# Patient Record
Sex: Female | Born: 1995 | Hispanic: Yes | Smoking: Never smoker
Health system: Southern US, Community
[De-identification: ages and names within clinical notes are randomized; demographics above are authoritative.]

## PROBLEM LIST (undated history)

## (undated) ENCOUNTER — Inpatient Hospital Stay (HOSPITAL_COMMUNITY): Payer: Self-pay

## (undated) ENCOUNTER — Emergency Department (HOSPITAL_COMMUNITY): Discharge status: Absent without leave | Payer: Self-pay | Source: Home / Self Care

## (undated) DIAGNOSIS — K8020 Calculus of gallbladder without cholecystitis without obstruction: Secondary | ICD-10-CM

## (undated) DIAGNOSIS — D649 Anemia, unspecified: Secondary | ICD-10-CM

## (undated) HISTORY — PX: NO PAST SURGERIES: SHX2092

---

## 2021-01-24 NOTE — L&D Delivery Note (Signed)
Delivery Note Tammy Cherry [name] is a 26 y.o. [phone] at [redacted]w[redacted]d admitted for active labor, fever/chills/UTI sx x 1d.   GBS Status: Negative/-- (06/09 1540) Maximum Maternal Temperature: 98.3 (100.4 at home, took apap @ 1600)  Labor course: Initial SVE: 6.5/80-2 @ 1945. Augmentation with:  none . She then progressed to complete.  ROM: SROM w/ delivery of head, clear fluid Birth: At 2049 a viable female was delivered via spontaneous vaginal delivery (Presentation: ROA). Nuchal cord present: No.  Shoulders and body delivered in usual fashion. Infant placed directly on mom's abdomen for bonding/skin-to-skin, baby dried and stimulated. Cord clamped x 2 after 1 minute and cut by FOB.  Cord blood collected.  The placenta separated spontaneously and delivered via gentle cord traction.  Pitocin infused rapidly IV per protocol.  Initial EBL ~6ml, fundus boggy ~48ml clots cleared from LUS, cytotec ( buccal/471mcg rectal) given, again fundus boggy, ~ clots cleared from LUS, given TXA and methergine, fentanyl, uterine sweep w/o any retained products. In & out cath, urine. Fundus firming, few more clots removed from LUS. Then fundus firm w/o clots on next check.  Placenta inspected and appears to be intact with a 3 VC.  Placenta/Cord with the following complications: none .  Cord pH: not done Sponge and instrument count were correct x2.  Intrapartum complications:  rapid labor Anesthesia:  none Episiotomy: none Lacerations:  none Suture Repair:  n/a QBL (mL): 242   Infant: APGAR (1 MIN):   APGAR (5 MINS):   APGAR (10 MINS):    Infant weight: pending  Mom to postpartum.  Baby to Couplet care / Skin to Skin. Placenta to Pathology for fever    Rocephin 2gm IV q 24hr Urine culture pending Plans to Breastfeed Contraception:  plans outpatient Nexplanon Circumcision: N/A  Note sent to [facility] for pp visit.  Cheral Marker CNM, [facility] 07/06/2021 9:41 PM

## 2021-03-01 ENCOUNTER — Other Ambulatory Visit: Payer: Self-pay | Admitting: *Deleted

## 2021-03-01 ENCOUNTER — Other Ambulatory Visit: Payer: Self-pay

## 2021-03-01 DIAGNOSIS — Z3481 Encounter for supervision of other normal pregnancy, first trimester: Secondary | ICD-10-CM

## 2021-03-04 ENCOUNTER — Encounter: Payer: Self-pay | Admitting: Family Medicine

## 2021-03-04 DIAGNOSIS — O09899 Supervision of other high risk pregnancies, unspecified trimester: Secondary | ICD-10-CM | POA: Insufficient documentation

## 2021-03-04 DIAGNOSIS — O99891 Other specified diseases and conditions complicating pregnancy: Secondary | ICD-10-CM | POA: Insufficient documentation

## 2021-03-04 DIAGNOSIS — Z2839 Other underimmunization status: Secondary | ICD-10-CM | POA: Insufficient documentation

## 2021-03-04 LAB — CBC/D/PLT+RPR+RH+ABO+RUBIGG...
Antibody Screen: NEGATIVE
Basophils Absolute: 0.1 10*3/uL (ref 0.0–0.2)
Basos: 1 %
Bilirubin, UA: NEGATIVE
EOS (ABSOLUTE): 0.1 10*3/uL (ref 0.0–0.4)
Eos: 1 %
Glucose, UA: NEGATIVE
HCV Ab: <0.1 s/co ratio (ref 0.0–0.9)
HIV Screen 4th Generation wRfx: NONREACTIVE
Hematocrit: 30.7 % — ABNORMAL LOW (ref 34.0–46.6)
Hemoglobin: 10.5 g/dL — ABNORMAL LOW (ref 11.1–15.9)
Hepatitis B Surface Ag: NEGATIVE
Immature Grans (Abs): 0.1 10*3/uL (ref 0.0–0.1)
Immature Granulocytes: 1 %
Ketones, UA: NEGATIVE
Lymphocytes Absolute: 2.3 10*3/uL (ref 0.7–3.1)
Lymphs: 19 %
MCH: 25.5 pg — ABNORMAL LOW (ref 26.6–33.0)
MCHC: 34.2 g/dL (ref 31.5–35.7)
MCV: 75 fL — ABNORMAL LOW (ref 79–97)
Monocytes Absolute: 0.8 10*3/uL (ref 0.1–0.9)
Monocytes: 6 %
Neutrophils Absolute: 8.7 10*3/uL — ABNORMAL HIGH (ref 1.4–7.0)
Neutrophils: 72 %
Nitrite, UA: POSITIVE — AB
Platelets: 294 10*3/uL (ref 150–450)
Protein,UA: NEGATIVE
RBC: 4.12 x10E6/uL (ref 3.77–5.28)
RDW: 14.3 % (ref 11.7–15.4)
RPR Ser Ql: NONREACTIVE
Rh Factor: POSITIVE
Rubella Antibodies, IGG: <0.9 index — ABNORMAL LOW (ref >0.99)
Specific Gravity, UA: 1.005 (ref 1.005–1.030)
Urobilinogen, Ur: 0.2 mg/dL (ref 0.2–1.0)
WBC: 12.2 10*3/uL — ABNORMAL HIGH (ref 3.4–10.8)
pH, UA: 7 (ref 5.0–7.5)

## 2021-03-04 LAB — HGB FRACTIONATION CASCADE
Hgb A2: 2.4 % (ref 1.8–3.2)
Hgb A: 97.6 % (ref 96.4–98.8)
Hgb F: 0 % (ref 0.0–2.0)
Hgb S: 0 %

## 2021-03-04 LAB — URINE CULTURE, OB REFLEX

## 2021-03-04 LAB — MICROSCOPIC EXAMINATION
Casts: NONE SEEN /lpf
Epithelial Cells (non renal): >10 /hpf — AB (ref 0–10)
RBC, Urine: NONE SEEN /hpf (ref 0–2)
WBC, UA: >30 /hpf — AB (ref 0–5)

## 2021-03-04 LAB — HCV INTERPRETATION

## 2021-03-07 NOTE — Progress Notes (Signed)
Patient Name: Tammy Cherry Date of Birth: Dec 04, 1995 [place] Initial Prenatal Visit  Tammy Cherry Tammy Cherry is a 26 y.o. year old No obstetric history on file. at Unknown who presents for her initial prenatal visit. Pregnancy is planned She reports  None . She is taking a prenatal vitamin.  She denies pelvic pain or vaginal bleeding.   Pregnancy Dating: The patient is dated by LMP.  LMP: 4/92/01 Period is certain:  Yes.  Periods were regular:  Yes.  LMP was a typical period:  Yes.  Using hormonal contraception in 3 months prior to conception: No  Lab Review: Blood type: O Rh Status: + Antibody screen: Negative HIV: Negative RPR: Negative Hemoglobin electrophoresis reviewed: Yes Results of OB urine culture are: Positive for e. Coli UTI  Rubella: Not immune Hep C Ab: Negative Varicella status: immune, had at 26 years old.   PMH: Reviewed and as detailed below: HTN: No  Gestational Hypertension/preeclampsia: No  Type 1 or 2 Diabetes: No  Depression:  No  Seizure disorder:  No VTE: No ,  History of STI No,  Abnormal Pap smear:  No, last one was in 2018 Genital herpes simplex:  No   PSH: Gynecologic Surgery:  no Surgical history reviewed, notable for: None  Obstetric History: Obstetric history tab updated and reviewed.  Summary of prior pregnancies: G3P2002 2015, female born at 74 weeks via SVD in [place] 2018, female born at 51 weeks via SVD in [place], spent time in the NICU for "low weight", there for 8 days Cesarean delivery: No  Gestational Diabetes:  No Hypertension in pregnancy: No History of preterm birth: No History of LGA/SGA infant:  Unclear, states her second son had spent time in NICU for being "low weight" History of shoulder dystocia: No Indications for referral were reviewed, and the patient has no obstetric indications for referral to [facility] at this time.   Social History: Partner's name: Network engineer  Tobacco use: No Alcohol use:  No Other substance use:  No  Current Medications:  Prenatals  Reviewed and appropriate in pregnancy.   Genetic and Infection Screen: Flow Sheet Updated Yes  Prenatal Exam: Gen: Well nourished, well developed.  No distress.  Vitals noted. HEENT: Normocephalic, atraumatic.  Neck supple.  Fair dentition. CV: RRR no murmur, gallops or rubs Lungs: CTA B.  Normal respiratory effort without wheezes or rales. Abd: soft, gravid, NTND. +BS.  GU: Normal external female genitalia without lesions.  Nl vaginal, well rugated without lesions. No vaginal discharge.  Bimanual exam: No adnexal mass or TTP. No CMT.  Uterus size above umbilicus Ext: No clubbing, cyanosis or edema. Psych: Normal grooming and dress.  Not depressed or anxious appearing.  Normal thought content and process without flight of ideas or looseness of associations  Fetal heart tones: Appropriate  Assessment/Plan:  Tammy Cherry is a 26 y.o. No obstetric history on file. at Unknown who presents to initiate prenatal care. She is doing well.  Current pregnancy issues include None.  Routine prenatal care: As dating is not reliable, a dating ultrasound has not been ordered. Dating tab updated. An anatomy ultrasound was ordered. Pre-pregnancy weight updated. Expected weight gain this pregnancy is 25-35 pounds - unclear as she does not know pre-pregnancy weight. Prenatal labs reviewed, notable for E. Coli UTI- rubella non-immune. O positive, antibody negative. Indications for referral to [facility] were reviewed and the patient does not meet criteria for referral.  Medication list  reviewed and updated.  Recommended patient see a dentist for regular care.  Bleeding and pain precautions reviewed. Importance of prenatal vitamins reviewed.  Genetic screening offered. Patient opted for: no screening. The patient has the following indications for aspirinto begin 81 mg at 12-16 weeks: One  high risk condition: no single high risk condition  MORE than one moderate risk condition: low SES   Aspirin was not  recommended today based upon above risk factors (one high risk condition or more than one moderate risk factor)  The patient will not be age 62 or over at time of delivery. Referral to genetic counseling was not offered today.  The patient has the following risk factors for preexisting diabetes: Reviewed indications for early 1 hour glucose testing, not indicated . An early 1 hour glucose tolerance test was not ordered. Pregnancy Medical Home and PHQ-9 forms completed, problems noted: Yes  2. Pregnancy issues include the following which were addressed today:  E. Coli UTI- will treat with Keflex 500 mg QID x5 days, consider repeat Urine culture at follow up. Rubella non-immune: offer MMR post-partum Unsure if vaccinated against Hep B, could consider screening at follow up to assess for immunity Unclear if history of SGA infant? Reports NICU stay for low weight. Unable to obtain records as birth occurred in [place] Medical release signed today to obtain records from [place], uninsured: adopt-A-mom patient  Unable to schedule anatomy ultrasound today as [facility] CMA to call at end of month to schedule.   Follow up 4 weeks for next prenatal visit with Faculty.

## 2021-03-08 ENCOUNTER — Other Ambulatory Visit (HOSPITAL_COMMUNITY)
Admission: RE | Admit: 2021-03-08 | Discharge: 2021-03-08 | Disposition: A | Discharge status: Home / Self Care | Payer: Self-pay | Source: Ambulatory Visit | Attending: Family Medicine | Admitting: Family Medicine

## 2021-03-08 ENCOUNTER — Encounter: Payer: Self-pay | Admitting: Family Medicine

## 2021-03-08 ENCOUNTER — Other Ambulatory Visit: Payer: Self-pay

## 2021-03-08 ENCOUNTER — Ambulatory Visit (INDEPENDENT_AMBULATORY_CARE_PROVIDER_SITE_OTHER): Payer: Self-pay | Admitting: Family Medicine

## 2021-03-08 VITALS — BP 120/72 | HR 99 | Temp 98.2°F | Wt 112.2 lb

## 2021-03-08 DIAGNOSIS — Z124 Encounter for screening for malignant neoplasm of cervix: Secondary | ICD-10-CM | POA: Insufficient documentation

## 2021-03-08 DIAGNOSIS — Z3490 Encounter for supervision of normal pregnancy, unspecified, unspecified trimester: Secondary | ICD-10-CM | POA: Insufficient documentation

## 2021-03-08 DIAGNOSIS — Z3480 Encounter for supervision of other normal pregnancy, unspecified trimester: Secondary | ICD-10-CM

## 2021-03-08 LAB — POCT WET PREP (WET MOUNT)
Clue Cells Wet Prep Whiff POC: NEGATIVE
Trichomonas Wet Prep HPF POC: ABSENT

## 2021-03-08 MED ORDER — CEPHALEXIN 500 MG PO CAPS: 500.0000 mg | ORAL_CAPSULE | Freq: Four times a day (QID) | ORAL | 0 refills | Status: AC

## 2021-03-08 NOTE — Patient Instructions (Signed)
Fue maravilloso verte hoy.  Por favor traiga TODOS sus medicamentos a cada visita.  Hoy hablamos de:  Envi un antibitico a su farmacia para tratar una infeccin del tracto urinario. Tome esto cuatro veces al da durante 5 [place]. Volveremos a revisar Lauris Poag de [place] en su prxima cita.  Hicimos una prueba de Papanicolaou hoy y estamos haciendo pruebas de rutina para Engineer, manufacturing infecciones de transmisin sexual. Conley Rolls enviar un mensaje de MyChart o lo llamar con los [place].  Contine tomando sus vitaminas prenatales diariamente. Le estamos programando una ecografa de anatoma.  Si experimenta sangrado vaginal, prdida de lquidos, no siente que su beb se mueva tanto o comienza a Technical brewer con menos de 5 minutos de diferencia, vaya directamente a la Unidad de Evaluacin Materna en [facility] para una evaluacin.  Servicios de atencin de mujeres y maternidad ubicados en el lado sur de [place]. [facility] (Entrada C en [address] [place], [place] [address]     Devona Konig elegir Medicina familiar de [place].  Llame al [phone] si tiene alguna pregunta sobre la cita de [place].  Asegrese de programar un seguimiento en la recepcin antes de irse hoy.  Sabino Dick, D.O. PGY-2 Medicina Familiar

## 2021-03-09 LAB — CYTOLOGY - PAP
Chlamydia: NEGATIVE
Comment: NEGATIVE
Comment: NORMAL
Diagnosis: NEGATIVE
Neisseria Gonorrhea: NEGATIVE

## 2021-03-19 ENCOUNTER — Emergency Department (HOSPITAL_COMMUNITY): Payer: Self-pay

## 2021-03-19 ENCOUNTER — Other Ambulatory Visit: Payer: Self-pay

## 2021-03-19 ENCOUNTER — Inpatient Hospital Stay (HOSPITAL_COMMUNITY)
Admission: EM | Admit: 2021-03-19 | Discharge: 2021-03-20 | Disposition: A | Discharge status: Home / Self Care | Payer: Self-pay | Attending: Obstetrics and Gynecology | Admitting: Obstetrics and Gynecology

## 2021-03-19 DIAGNOSIS — Z3A22 22 weeks gestation of pregnancy: Secondary | ICD-10-CM | POA: Insufficient documentation

## 2021-03-19 DIAGNOSIS — T1490XA Injury, unspecified, initial encounter: Secondary | ICD-10-CM

## 2021-03-19 DIAGNOSIS — Z6790 Unspecified blood type, Rh positive: Secondary | ICD-10-CM

## 2021-03-19 DIAGNOSIS — Z3A23 23 weeks gestation of pregnancy: Secondary | ICD-10-CM

## 2021-03-19 DIAGNOSIS — Z6740 Type O blood, Rh positive: Secondary | ICD-10-CM | POA: Insufficient documentation

## 2021-03-19 DIAGNOSIS — O9A212 Injury, poisoning and certain other consequences of external causes complicating pregnancy, second trimester: Secondary | ICD-10-CM

## 2021-03-19 DIAGNOSIS — O26892 Other specified pregnancy related conditions, second trimester: Secondary | ICD-10-CM | POA: Insufficient documentation

## 2021-03-19 DIAGNOSIS — Y93E1 Activity, personal bathing and showering: Secondary | ICD-10-CM | POA: Insufficient documentation

## 2021-03-19 DIAGNOSIS — S0990XA Unspecified injury of head, initial encounter: Secondary | ICD-10-CM | POA: Insufficient documentation

## 2021-03-19 DIAGNOSIS — W182XXA Fall in (into) shower or empty bathtub, initial encounter: Secondary | ICD-10-CM | POA: Insufficient documentation

## 2021-03-19 LAB — COMPREHENSIVE METABOLIC PANEL
ALT: 18 U/L (ref 0–44)
AST: 18 U/L (ref 15–41)
Albumin: 3.3 g/dL — ABNORMAL LOW (ref 3.5–5.0)
Alkaline Phosphatase: 90 U/L (ref 38–126)
Anion gap: 12 (ref 5–15)
BUN: <5 mg/dL — ABNORMAL LOW (ref 6–20)
CO2: 20 mmol/L — ABNORMAL LOW (ref 22–32)
Calcium: 9 mg/dL (ref 8.9–10.3)
Chloride: 103 mmol/L (ref 98–111)
Creatinine, Ser: 0.49 mg/dL (ref 0.44–1.00)
GFR, Estimated: >60 mL/min (ref >60)
Glucose, Bld: 79 mg/dL (ref 70–99)
Potassium: 3.8 mmol/L (ref 3.5–5.1)
Sodium: 135 mmol/L (ref 135–145)
Total Bilirubin: 0.1 mg/dL — ABNORMAL LOW (ref 0.3–1.2)
Total Protein: 7.3 g/dL (ref 6.5–8.1)

## 2021-03-19 LAB — I-STAT CHEM 8, ED
BUN: <3 mg/dL — ABNORMAL LOW (ref 6–20)
Calcium, Ion: 1.14 mmol/L — ABNORMAL LOW (ref 1.15–1.40)
Chloride: 104 mmol/L (ref 98–111)
Creatinine, Ser: 0.4 mg/dL — ABNORMAL LOW (ref 0.44–1.00)
Glucose, Bld: 78 mg/dL (ref 70–99)
HCT: 36 % (ref 36.0–46.0)
Hemoglobin: 12.2 g/dL (ref 12.0–15.0)
Potassium: 3.9 mmol/L (ref 3.5–5.1)
Sodium: 136 mmol/L (ref 135–145)
TCO2: 22 mmol/L (ref 22–32)

## 2021-03-19 LAB — CBC
HCT: 35.5 % — ABNORMAL LOW (ref 36.0–46.0)
Hemoglobin: 11.3 g/dL — ABNORMAL LOW (ref 12.0–15.0)
MCH: 25.2 pg — ABNORMAL LOW (ref 26.0–34.0)
MCHC: 31.8 g/dL (ref 30.0–36.0)
MCV: 79.2 fL — ABNORMAL LOW (ref 80.0–100.0)
Platelets: 361 10*3/uL (ref 150–400)
RBC: 4.48 MIL/uL (ref 3.87–5.11)
RDW: 14.2 % (ref 11.5–15.5)
WBC: 15.5 10*3/uL — ABNORMAL HIGH (ref 4.0–10.5)
nRBC: 0 % (ref 0.0–0.2)

## 2021-03-19 LAB — PROTIME-INR
INR: 1 (ref 0.8–1.2)
Prothrombin Time: 13.2 seconds (ref 11.4–15.2)

## 2021-03-19 LAB — LACTIC ACID, PLASMA: Lactic Acid, Venous: 1.3 mmol/L (ref 0.5–1.9)

## 2021-03-19 LAB — ETHANOL: Alcohol, Ethyl (B): <10 mg/dL (ref <10)

## 2021-03-19 LAB — SAMPLE TO BLOOD BANK

## 2021-03-19 MED ORDER — IOHEXOL 300 MG/ML  SOLN
100.0000 mL | Freq: Once | INTRAMUSCULAR | Status: AC | PRN
  Administered 2021-03-19: 100 mL via INTRAVENOUS

## 2021-03-19 MED ORDER — SODIUM CHLORIDE 0.9 % IV BOLUS
1000.0000 mL | Freq: Once | INTRAVENOUS | Status: AC
Start: 2021-03-19 — End: 2021-03-19
  Administered 2021-03-19: 1000 mL via INTRAVENOUS

## 2021-03-19 NOTE — ED Notes (Signed)
Rapid OB contacted and enroute 

## 2021-03-19 NOTE — ED Triage Notes (Signed)
Pt came in POV after fall in the shower. Patient reports abd pain, [redacted] weeks pregnant. Spanish speaking, A&O x4, reports 10/10 shoulder pain at this time.

## 2021-03-19 NOTE — Progress Notes (Signed)
Late entry  2158 RROB received call for patient in Main ED, [redacted] wks gestation s/p fall in the shower, landed on abdomen and hit head with loc. Room 32  2205 RROB arrived to room 32. Patient being hooked up to [place] and ctx monitor by ED staff. With patient gestation 22.6wks, this RN used doppler to get FHT at 120's, no contractions showing on monitor. Patient denies contractions, vaginal bleeding or leaking of fluid. Patient complains of head pain on left side and abdominal pain. Patient states she see's [facility] for care.  2214 RROB contacted OB Attending and provided above report and ED providers name and current plan of care. ED is to call OB Attending once patient is cleared from ED so that patient can be transferred to MAU for additional monitoring. RROB is to be called if patient symptoms change from [facility] standpoint (such as patient begins to have contractions, vaginal bleeding, vaginal fluid leaking, etc.) ED provider and RN verbalized understanding.   [place] arrived back on L&D unit  Wendi Maya, RN RROB

## 2021-03-19 NOTE — ED Provider Notes (Addendum)
[facility] EMERGENCY DEPARTMENT Provider Note   CSN: [id number] Arrival date & time: 03/19/21  2134     History  Chief Complaint  Patient presents with   Tammy Cherry is a 26 y.o. female.  The history is provided by the patient, medical records and a significant other. The history is limited by a language barrier. A language interpreter was used.  Fall This is a new problem. The current episode started less than 1 hour ago. The problem has not changed since onset.Associated symptoms include chest pain, abdominal pain and headaches. Pertinent negatives include no shortness of breath. Nothing aggravates the symptoms. Nothing relieves the symptoms. She has tried nothing for the symptoms. The treatment provided no relief.      Home Medications Prior to Admission medications   Medication Sig Start Date End Date Taking? Authorizing Provider  Prenatal Vit-Fe Fumarate-FA (MULTIVITAMIN-PRENATAL) 27-0.8 MG TABS tablet Take 1 tablet by mouth daily at 12 noon.    [provider]      Allergies    Patient has no allergy information on record.    Review of Systems   Review of Systems  Constitutional:  Negative for chills, diaphoresis and fatigue.  HENT:  Negative for congestion.   Eyes:  Negative for visual disturbance.  Respiratory:  Negative for chest tightness and shortness of breath.   Cardiovascular:  Positive for chest pain.  Gastrointestinal:  Positive for abdominal pain. Negative for constipation, diarrhea, nausea and vomiting.  Genitourinary:  Negative for dysuria and flank pain.  Musculoskeletal:  Negative for back pain, neck pain and neck stiffness.  Skin:  Negative for rash and wound.  Neurological:  Positive for syncope (with head injury) and headaches. Negative for dizziness and light-headedness.  Psychiatric/Behavioral:  Negative for agitation.   All other systems reviewed and are negative.  Physical Exam Updated Vital  Signs BP 133/89    Pulse 99    Temp 98.1 F (36.7 C) (Oral)    Resp 20    Ht 5' 4.96" (1.65 m)    Wt 50.9 kg    LMP 10/10/2020    SpO2 100%    BMI 18.70 kg/m  Physical Exam Vitals and nursing note reviewed.  Constitutional:      General: She is not in acute distress.    Appearance: She is well-developed. She is not ill-appearing, toxic-appearing or diaphoretic.  HENT:     Head: Normocephalic and atraumatic.     Nose: Nose normal. No congestion.     Mouth/Throat:     Mouth: Mucous membranes are moist.  Eyes:     Extraocular Movements: Extraocular movements intact.     Conjunctiva/sclera: Conjunctivae normal.     Pupils: Pupils are equal, round, and reactive to light.  Cardiovascular:     Rate and Rhythm: Normal rate and regular rhythm.     Heart sounds: No murmur heard. Pulmonary:     Effort: Pulmonary effort is normal. No respiratory distress.     Breath sounds: Normal breath sounds. No wheezing, rhonchi or rales.  Chest:     Chest wall: Tenderness present.  Abdominal:     Palpations: Abdomen is soft.     Tenderness: There is abdominal tenderness.  Musculoskeletal:        General: No swelling.     Cervical back: Neck supple. No tenderness.     Right lower leg: No edema.     Left lower leg: No edema.  Skin:  General: Skin is warm and dry.     Capillary Refill: Capillary refill takes less than 2 seconds.     Findings: No erythema.  Neurological:     General: No focal deficit present.     Mental Status: She is alert.  Psychiatric:        Mood and Affect: Mood normal.    ED Results / Procedures / Treatments   Labs (all labs ordered are listed, but only abnormal results are displayed) Labs Reviewed  COMPREHENSIVE METABOLIC PANEL - Abnormal; Notable for the following components:      Result Value   CO2 20 (*)    BUN <5 (*)    Albumin 3.3 (*)    Total Bilirubin 0.1 (*)    All other components within normal limits  CBC - Abnormal; Notable for the following  components:   WBC 15.5 (*)    Hemoglobin 11.3 (*)    HCT 35.5 (*)    MCV 79.2 (*)    MCH 25.2 (*)    All other components within normal limits  I-STAT CHEM 8, ED - Abnormal; Notable for the following components:   BUN <3 (*)    Creatinine, Ser 0.40 (*)    Calcium, Ion 1.14 (*)    All other components within normal limits  RESP PANEL BY RT-PCR (FLU A&B, COVID) ARPGX2  ETHANOL  LACTIC ACID, PLASMA  PROTIME-INR  URINALYSIS, ROUTINE W REFLEX MICROSCOPIC  SAMPLE TO BLOOD BANK    EKG None  Radiology DG Pelvis Portable  Result Date: 03/19/2021 CLINICAL DATA:  Pelvic pain fell in shower EXAM: PORTABLE PELVIS 1-2 VIEWS COMPARISON:  None. FINDINGS: SI joints are non widened. Pubic symphysis and rami are intact. No fracture is seen. IUP is present. IMPRESSION: No acute osseous abnormality Electronically Signed   By: Donavan Foil M.D.   On: 03/19/2021 23:06   DG Chest Port 1 View  Result Date: 03/19/2021 CLINICAL DATA:  Fall, chest injury. EXAM: PORTABLE CHEST 1 VIEW COMPARISON:  None. FINDINGS: The heart size and mediastinal contours are within normal limits. Both lungs are clear. The visualized skeletal structures are unremarkable. IMPRESSION: No active disease. Electronically Signed   By: Fidela Salisbury M.D.   On: 03/19/2021 22:21    Procedures Procedures    Medications Ordered in ED Medications  sodium chloride 0.9 % bolus 1,000 mL (0 mLs Intravenous Stopped 03/19/21 2315)    ED Course/ Medical Decision Making/ A&P                           Medical Decision Making Amount and/or Complexity of Data Reviewed Labs: ordered. Radiology: ordered.  Risk Prescription drug management. Decision regarding hospitalization.   Tammy Cherry is a 26 y.o. female who is currently 22 weeks and 6 days pregnant who presents for fall with trauma.  According to patient and family, patient was in the shower today and had a fall hitting her head and knocking herself out.  She is  complaining of pain in her left upper chest as well as her abdomen.  She reports that her baby was moving vigorously before the fall and then since the fall has not had any movement.  She denies any vaginal bleeding or loss  or gush of fluids.  She is reporting severe pain in her abdomen and severe headache.  She denies nausea, vomiting, or vision changes.  Denies speech difficulties.  Denies any numbness or tingling or weakness in  extremities.  No history of significant injuries.  She reports that UTI with her pregnancy earlier but no other pregnancy complications.  She was made a level 2 trauma upon arrival due to the abdominal pain and pregnancy.  On my exam, abdomen is slightly tender to palpation.  Bowel sounds are appreciated.  Lungs were clear.  Left upper chest was tender to palpation.  No lacerations or tenderness focally on her head.  No focal neurologic deficits initially.  Interpreter was used for all conversation.  B arrived and was able to get fetal heart tones.  X-ray was obtained of the chest and I did not see pneumothorax however with her significant pain in the chest, abdomen, head, after discussion with trauma surgery, we will get CT of the head and chest/abdomen/pelvis to rule out acute traumatic injuries.  We will also get labs and give her some fluids.  The OB/GYN team feels that when she is medically cleared, she will be stable to go to the MAU for further more extended monitoring.  Care transferred to oncoming team to await CT imaging prior to transfer to the MAU for further monitoring.  12:00 AM CTs returned showing no evidence of acute traumatic injuries.  I spoke to Dr. Delphia Grates with the OB/GYN team who did not feel that ultrasound was needed.  They do want to have the patient transferred to the MAU so they can monitor for several more hours and then she will likely go home after.  On reassessment she is feeling better but she still having some pain with her left upper chest.   Suspect musculoskeletal pain.  She was able to move her arm and had no tenderness in the deltoid area down the shoulder towards the hand.  Normal grip strength, pulse, and sensation.  Patient will be transferred to the MAU for further monitoring and management.  Anticipate discharge home.  She is not having any urinary symptoms.  We will add on urine culture as there is some rare bacteria.  Will defer management of this to the OB/GYN team.  Anticipate discharge after monitoring by OB.   Final Clinical Impression(s) / ED Diagnoses Final diagnoses:  Trauma     Clinical Impression: 1. Trauma     Disposition: Care transferred to oncoming team to await CT imaging to rule out acute traumatic injuries.  If CT imaging is reassuring, anticipate transfer to the MAU for further monitoring of abdominal pain after fall with pregnancy.  This note was prepared with assistance of Systems analyst. Occasional wrong-word or sound-a-like substitutions may have occurred due to the inherent limitations of voice recognition software.      Allie Ousley, Gwenyth Allegra, MD 03/19/21 2332    Aldea Avis, Gwenyth Allegra, MD 03/20/21 0002

## 2021-03-20 ENCOUNTER — Encounter (HOSPITAL_COMMUNITY): Payer: Self-pay | Admitting: Obstetrics and Gynecology

## 2021-03-20 DIAGNOSIS — Z6790 Unspecified blood type, Rh positive: Secondary | ICD-10-CM

## 2021-03-20 DIAGNOSIS — W182XXA Fall in (into) shower or empty bathtub, initial encounter: Secondary | ICD-10-CM

## 2021-03-20 DIAGNOSIS — O9A212 Injury, poisoning and certain other consequences of external causes complicating pregnancy, second trimester: Secondary | ICD-10-CM

## 2021-03-20 DIAGNOSIS — Z3A23 23 weeks gestation of pregnancy: Secondary | ICD-10-CM

## 2021-03-20 DIAGNOSIS — S0990XA Unspecified injury of head, initial encounter: Secondary | ICD-10-CM

## 2021-03-20 LAB — URINALYSIS, ROUTINE W REFLEX MICROSCOPIC
Bilirubin Urine: NEGATIVE
Glucose, UA: NEGATIVE mg/dL
Ketones, ur: NEGATIVE mg/dL
Nitrite: NEGATIVE
Protein, ur: NEGATIVE mg/dL
Specific Gravity, Urine: 1.005 (ref 1.005–1.030)
pH: 6 (ref 5.0–8.0)

## 2021-03-20 MED ORDER — CYCLOBENZAPRINE HCL 10 MG PO TABS: 10.0000 mg | ORAL_TABLET | Freq: Three times a day (TID) | ORAL | 0 refills | Status: DC | PRN

## 2021-03-20 MED ORDER — CYCLOBENZAPRINE HCL 5 MG PO TABS
10.0000 mg | ORAL_TABLET | Freq: Once | ORAL | Status: AC
  Administered 2021-03-20: 10 mg via ORAL
  Filled 2021-03-20: qty 2

## 2021-03-20 MED ORDER — TRAMADOL HCL 50 MG PO TABS: 50.0000 mg | ORAL_TABLET | Freq: Four times a day (QID) | ORAL | 0 refills | Status: DC | PRN

## 2021-03-20 NOTE — ED Notes (Signed)
Report given to Wheeler, RN at MAU, transport called to take pt to MAU.

## 2021-03-20 NOTE — MAU Provider Note (Signed)
Chief Complaint: Fall   Event Date/Time   First Provider Initiated Contact with Patient 03/20/21 0113      SUBJECTIVE HPI: Tammy Cherry is a 26 y.o. G3P2002 at [redacted]w[redacted]d by LMP who presents to maternity admissions sent from the ED following a fall in the shower hitting her head and shoulder area and causing her to lose consciousness.  She reports good fetal movement and denies any abdominal pain. She does not think she hit her abdomen directly.  She reports headache, especially on left side where she hit her head as 10/10 pain and pain in her left shoulder/arm and left upper chest as 7-8 out of 10.  The pain is constant but worsens with movement.  There is no associated nausea or vomiting.  She was evaluated in the ED with head and chest CT and CXR without abnormal findings and was transferred to MAU for monitoring of pregnancy.    HPI  History reviewed. No pertinent past medical history. History reviewed. No pertinent surgical history. Social History   Socioeconomic History   Marital status: Single    Spouse name: Not on file   Number of children: Not on file   Years of education: Not on file   Highest education level: Not on file  Occupational History   Not on file  Tobacco Use   Smoking status: Never   Smokeless tobacco: Never  Vaping Use   Vaping Use: Never used  Substance and Sexual Activity   Alcohol use: Never   Drug use: Never   Sexual activity: Yes    Birth control/protection: None  Other Topics Concern   Not on file  Social History Narrative   Not on file   Social Determinants of Health   Financial Resource Strain: Not on file  Food Insecurity: Not on file  Transportation Needs: Not on file  Physical Activity: Not on file  Stress: Not on file  Social Connections: Not on file  Intimate Partner Violence: Not on file   No current facility-administered medications on file prior to encounter.   Current Outpatient Medications on File Prior to Encounter   Medication Sig Dispense Refill   Prenatal Vit-Fe Fumarate-FA (MULTIVITAMIN-PRENATAL) 27-0.8 MG TABS tablet Take 1 tablet by mouth daily at 12 noon.     Not on File  ROS:  Review of Systems  Constitutional:  Negative for chills, fatigue and fever.  Eyes:  Negative for visual disturbance.  Respiratory:  Negative for shortness of breath.   Cardiovascular:  Positive for chest pain.  Gastrointestinal:  Negative for abdominal pain, nausea and vomiting.  Genitourinary:  Negative for difficulty urinating, dysuria, flank pain, pelvic pain, vaginal bleeding, vaginal discharge and vaginal pain.  Musculoskeletal:  Positive for myalgias.  Neurological:  Positive for syncope and headaches. Negative for dizziness.  Psychiatric/Behavioral: Negative.      I have reviewed patient's Past Medical Hx, Surgical Hx, Family Hx, Social Hx, medications and allergies.   Physical Exam  Patient Vitals for the past 24 hrs:  BP Temp Temp src Pulse Resp SpO2 Height Weight  03/20/21 0058 125/75 98.9 F (37.2 C) Oral 88 18 99 % -- 67 kg  03/20/21 0000 113/70 98 F (36.7 C) Oral 86 18 100 % -- --  03/19/21 2300 116/78 -- -- 87 (!) 21 100 % -- --  03/19/21 2222 -- -- -- -- -- -- 5' 4.96" (1.65 m) 50.9 kg  03/19/21 2218 -- 98.1 F (36.7 C) Oral -- -- -- -- --  03/19/21  2206 133/89 -- -- 99 20 100 % -- --   Constitutional: Well-developed, well-nourished female in mild distress.  Cardiovascular: normal rate Respiratory: normal effort GI: Abd soft, non-tender. Pos BS x 4 MS: Extremities nontender, no edema, normal ROM Neurological - PERRLA, alert, oriented, normal speech, no focal findings or movement disorder noted, screening mental status exam normal, cranial nerves II through XII intact, DTR's normal and symmetric, motor and sensory grossly normal bilaterally, normal muscle tone, no tremors, strength 5/5  GU: Neg CVAT.  PELVIC EXAM: Deferred  FHR baseline 135 with moderate variability and 10 x 10 accels  present No contractions on toco or to palpation  LAB RESULTS Results for orders placed or performed during the hospital encounter of 03/19/21 (from the past 24 hour(s))  Sample to Blood Bank     Status: None   Collection Time: 03/19/21 10:14 PM  Result Value Ref Range   Blood Bank Specimen SAMPLE AVAILABLE FOR TESTING    Sample Expiration      03/20/2021,2359 Performed at [facility], [place] [address], [place], [place] [address]   Comprehensive metabolic panel     Status: Abnormal   Collection Time: 03/19/21 10:19 PM  Result Value Ref Range   Sodium 135 135 - 145 mmol/L   Potassium 3.8 3.5 - 5.1 mmol/L   Chloride 103 98 - 111 mmol/L   CO2 20 (L) 22 - 32 mmol/L   Glucose, Bld 79 70 - 99 mg/dL   BUN <5 (L) 6 - 20 mg/dL   Creatinine, Ser 0.49 0.44 - 1.00 mg/dL   Calcium 9.0 8.9 - 10.3 mg/dL   Total Protein 7.3 6.5 - 8.1 g/dL   Albumin 3.3 (L) 3.5 - 5.0 g/dL   AST 18 15 - 41 U/L   ALT 18 0 - 44 U/L   Alkaline Phosphatase 90 38 - 126 U/L   Total Bilirubin 0.1 (L) 0.3 - 1.2 mg/dL   GFR, Estimated >60 >60 mL/min   Anion gap 12 5 - 15  CBC     Status: Abnormal   Collection Time: 03/19/21 10:19 PM  Result Value Ref Range   WBC 15.5 (H) 4.0 - 10.5 K/uL   RBC 4.48 3.87 - 5.11 MIL/uL   Hemoglobin 11.3 (L) 12.0 - 15.0 g/dL   HCT 35.5 (L) 36.0 - 46.0 %   MCV 79.2 (L) 80.0 - 100.0 fL   MCH 25.2 (L) 26.0 - 34.0 pg   MCHC 31.8 30.0 - 36.0 g/dL   RDW 14.2 11.5 - 15.5 %   Platelets 361 150 - 400 K/uL   nRBC 0.0 0.0 - 0.2 %  Ethanol     Status: None   Collection Time: 03/19/21 10:19 PM  Result Value Ref Range   Alcohol, Ethyl (B) <10 <10 mg/dL  Lactic acid, plasma     Status: None   Collection Time: 03/19/21 10:19 PM  Result Value Ref Range   Lactic Acid, Venous 1.3 0.5 - 1.9 mmol/L  Protime-INR     Status: None   Collection Time: 03/19/21 10:19 PM  Result Value Ref Range   Prothrombin Time 13.2 11.4 - 15.2 seconds   INR 1.0 0.8 - 1.2  I-Stat Chem 8, ED     Status: Abnormal    Collection Time: 03/19/21 10:22 PM  Result Value Ref Range   Sodium 136 135 - 145 mmol/L   Potassium 3.9 3.5 - 5.1 mmol/L   Chloride 104 98 - 111 mmol/L   BUN <3 (  L) 6 - 20 mg/dL   Creatinine, Ser 0.40 (L) 0.44 - 1.00 mg/dL   Glucose, Bld 78 70 - 99 mg/dL   Calcium, Ion 1.14 (L) 1.15 - 1.40 mmol/L   TCO2 22 22 - 32 mmol/L   Hemoglobin 12.2 12.0 - 15.0 g/dL   HCT 36.0 36.0 - 46.0 %  Urinalysis, Routine w reflex microscopic     Status: Abnormal   Collection Time: 03/19/21 11:38 PM  Result Value Ref Range   Color, Urine STRAW (A) YELLOW   APPearance CLEAR CLEAR   Specific Gravity, Urine 1.005 1.005 - 1.030   pH 6.0 5.0 - 8.0   Glucose, UA NEGATIVE NEGATIVE mg/dL   Hgb urine dipstick SMALL (A) NEGATIVE   Bilirubin Urine NEGATIVE NEGATIVE   Ketones, ur NEGATIVE NEGATIVE mg/dL   Protein, ur NEGATIVE NEGATIVE mg/dL   Nitrite NEGATIVE NEGATIVE   Leukocytes,Ua MODERATE (A) NEGATIVE   RBC / HPF 0-5 0 - 5 RBC/hpf   WBC, UA 6-10 0 - 5 WBC/hpf   Bacteria, UA RARE (A) NONE SEEN   Squamous Epithelial / LPF 0-5 0 - 5    O/Positive/-- (02/06 1449)  IMAGING CT HEAD WO CONTRAST  Result Date: 03/19/2021 CLINICAL DATA:  Pregnant patient, status post fall in shower. EXAM: CT HEAD WITHOUT CONTRAST TECHNIQUE: Contiguous axial images were obtained from the base of the skull through the vertex without intravenous contrast. RADIATION DOSE REDUCTION: This exam was performed according to the departmental dose-optimization program which includes automated exposure control, adjustment of the mA and/or kV according to patient size and/or use of iterative reconstruction technique. COMPARISON:  None. FINDINGS: Brain: No evidence of acute infarction, hemorrhage, hydrocephalus, extra-axial collection or mass lesion/mass effect. Vascular: No hyperdense vessel or unexpected calcification. Skull: Normal. Negative for fracture or focal lesion. Sinuses/Orbits: No acute finding. Other: None. IMPRESSION: No acute  intracranial pathology. Electronically Signed   By: Virgina Norfolk M.D.   On: 03/19/2021 23:47   DG Pelvis Portable  Result Date: 03/19/2021 CLINICAL DATA:  Pelvic pain fell in shower EXAM: PORTABLE PELVIS 1-2 VIEWS COMPARISON:  None. FINDINGS: SI joints are non widened. Pubic symphysis and rami are intact. No fracture is seen. IUP is present. IMPRESSION: No acute osseous abnormality Electronically Signed   By: Donavan Foil M.D.   On: 03/19/2021 23:06   CT CHEST ABDOMEN PELVIS W CONTRAST  Result Date: 03/19/2021 CLINICAL DATA:  Pregnant, fell in shower. EXAM: CT CHEST, ABDOMEN, AND PELVIS WITH CONTRAST TECHNIQUE: Multidetector CT imaging of the chest, abdomen and pelvis was performed following the standard protocol during bolus administration of intravenous contrast. RADIATION DOSE REDUCTION: This exam was performed according to the departmental dose-optimization program which includes automated exposure control, adjustment of the mA and/or kV according to patient size and/or use of iterative reconstruction technique. CONTRAST:  142mL OMNIPAQUE IOHEXOL 300 MG/ML  SOLN COMPARISON:  None. FINDINGS: CT CHEST FINDINGS Cardiovascular: No significant vascular findings. Normal heart size. No pericardial effusion. Mediastinum/Nodes: No enlarged mediastinal, hilar, or axillary lymph nodes. Thyroid gland, trachea, and esophagus demonstrate no significant findings. Lungs/Pleura: Lungs are clear. No pleural effusion or pneumothorax. Musculoskeletal: No chest wall mass or suspicious bone lesions identified. CT ABDOMEN PELVIS FINDINGS Hepatobiliary: No focal liver abnormality is seen. No gallstones, gallbladder wall thickening, or biliary dilatation. Pancreas: Unremarkable. No pancreatic ductal dilatation or surrounding inflammatory changes. Spleen: Normal in size without focal abnormality. Adrenals/Urinary Tract: Adrenal glands are unremarkable. Kidneys are normal in size, without focal lesions. There is marked  severity  right-sided hydronephrosis and proximal to mid right hydroureter. Bladder is unremarkable. Stomach/Bowel: Stomach is within normal limits. Appendix appears normal. No evidence of bowel wall thickening, distention, or inflammatory changes. Vascular/Lymphatic: No significant vascular findings are present. No enlarged abdominal or pelvic lymph nodes. Reproductive: A single intrauterine pregnancy is seen, positioned within the mid to lower right abdomen and right hemipelvis. There is suspected mass effect on the mid to distal right ureter. The placenta is anterior in position and is unremarkable. Other: No abdominal wall hernia or abnormality. No abdominopelvic ascites. Musculoskeletal: No acute or significant osseous findings. IMPRESSION: 1. Single intrauterine pregnancy, as described above. Correlation with pelvic ultrasound is recommended to confirm post-traumatic changes to the fetus, placenta and uterus, given the patient's history of recent fall. 2. No evidence of traumatic injury within the chest, abdomen or pelvis. 3. Marked severity right-sided hydronephrosis and hydroureter which may be secondary to partial obstruction secondary to positioning of the previously noted intrauterine pregnancy. Electronically Signed   By: Virgina Norfolk M.D.   On: 03/19/2021 23:46   DG Chest Port 1 View  Result Date: 03/19/2021 CLINICAL DATA:  Fall, chest injury. EXAM: PORTABLE CHEST 1 VIEW COMPARISON:  None. FINDINGS: The heart size and mediastinal contours are within normal limits. Both lungs are clear. The visualized skeletal structures are unremarkable. IMPRESSION: No active disease. Electronically Signed   By: Fidela Salisbury M.D.   On: 03/19/2021 22:21    MAU Management/MDM: Orders Placed This Encounter  Procedures   Resp Panel by RT-PCR (Flu A&B, Covid) Nasopharyngeal Swab   Urine Culture   DG Chest Port 1 View   DG Pelvis Portable   CT CHEST ABDOMEN PELVIS W CONTRAST   CT HEAD WO CONTRAST    Comprehensive metabolic panel   CBC   Ethanol   Urinalysis, Routine w reflex microscopic   Lactic acid, plasma   Protime-INR   Diet NPO time specified   Cardiac monitoring   Measure blood pressure   Initiate Carrier Fluid Protocol   Pulse oximetry, continuous   I-Stat Chem 8, ED   Sample to Blood Bank   Discharge patient    Meds ordered this encounter  Medications   sodium chloride 0.9 % bolus 1,000 mL   iohexol (OMNIPAQUE) 300 MG/ML solution 100 mL   cyclobenzaprine (FLEXERIL) tablet 10 mg   cyclobenzaprine (FLEXERIL) 10 MG tablet    Sig: Take 1 tablet (10 mg total) by mouth 3 (three) times daily as needed for muscle spasms.    Dispense:  20 tablet    Refill:  0    Order Specific Question:   Supervising Provider    Answer:   Rip Harbour, MICHAEL L [[id number]]   traMADol (ULTRAM) 50 MG tablet    Sig: Take 1-2 tablets (50-100 mg total) by mouth every 6 (six) hours as needed.    Dispense:  10 tablet    Refill:  0    Order Specific Question:   Supervising Provider    Answer:   ERVIN, MICHAEL L [[id number]]    FHR tracing appropriate for gestational age in MAU.  Pt without obstetric complaints ~ 4 hours after trauma occurred.  Consult Dr Rip Harbour with assessment and findings.  Flexeril 10 mg PO given in MAU. Pt reports headache and shoulder pain improved.  Offered Tramadol in MAU but pt prefers discharge to home and Rx for medications. Rx for Flexeril and Tramadol for short course of PRN use.  F/U with [facility] for prenatal care. Warning signs for head  injury and pregnancy/reasons to seek emergency care reviewed.    ASSESSMENT 1. Traumatic injury during pregnancy in second trimester   2. Trauma   3. Fall in shower   4. [redacted] weeks gestation of pregnancy   5. Injury of head, initial encounter   6. Blood type, Rh positive     PLAN Discharge home Allergies as of 03/20/2021   Not on File      Medication List     TAKE these medications    cyclobenzaprine 10 MG tablet Commonly known as:  FLEXERIL Take 1 tablet (10 mg total) by mouth 3 (three) times daily as needed for muscle spasms.   multivitamin-prenatal 27-0.8 MG Tabs tablet Take 1 tablet by mouth daily at 12 noon.   traMADol 50 MG tablet Commonly known as: ULTRAM Take 1-2 tablets (50-100 mg total) by mouth every 6 (six) hours as needed.        Follow-up [facility] Follow up.   Why: As scheduled, Regresar a MAU segn sea necesario para emergencia Contact information: [place] Certified Nurse-Midwife 03/20/2021  2:31 AM

## 2021-03-20 NOTE — Progress Notes (Signed)
°   03/19/21 2210  Clinical Encounter Type  Visited With Patient and family together  Visit Type Initial;Trauma  Referral From Nurse  Consult/Referral To None   Chaplain responded to a level two trauma. Patient was receiving medical care and family member was sitting quietly and listening.  I spoke to the patient after everything settled down through the interpreter. They said they were ok at this time.   Valerie Roys Chaplain Resident [facility]  [phone]

## 2021-03-22 ENCOUNTER — Telehealth: Payer: Self-pay | Admitting: Family Medicine

## 2021-03-22 ENCOUNTER — Telehealth: Payer: Self-pay

## 2021-03-22 LAB — URINE CULTURE: Culture: >=100000 — AB

## 2021-03-22 NOTE — Telephone Encounter (Signed)
Appointment made for patient at   [facility] Department Ultrasound (detailed Anatomy Scan) 03/25/2021 Thursday 1115  Patient os to bring photo ID and $160 to cover portion of ultrasound as she is an ADOPT A MOM patient.  Patient was called by Dr.  Nita Sells and informed of appointment.  [name], [place]

## 2021-03-22 NOTE — Telephone Encounter (Signed)
Call patient to discuss her anatomy ultrasound appointment this upcoming Thursday at 1115 at the health department.  Knows to bring $160 and ID card to appointment.

## 2021-04-08 ENCOUNTER — Ambulatory Visit (INDEPENDENT_AMBULATORY_CARE_PROVIDER_SITE_OTHER): Payer: Self-pay | Admitting: Family Medicine

## 2021-04-08 ENCOUNTER — Other Ambulatory Visit: Payer: Self-pay

## 2021-04-08 VITALS — BP 130/81 | HR 105 | Wt 147.7 lb

## 2021-04-08 DIAGNOSIS — O2342 Unspecified infection of urinary tract in pregnancy, second trimester: Secondary | ICD-10-CM

## 2021-04-08 DIAGNOSIS — Z3480 Encounter for supervision of other normal pregnancy, unspecified trimester: Secondary | ICD-10-CM

## 2021-04-08 NOTE — Patient Instructions (Signed)
Precauciones de devolución relacionadas con el embarazo ° °Los siguientes son signos/síntomas que son anormales en el embarazo y pueden requerir una evaluación adicional por parte de un médico: °Vaya a MAU en [facility] [place] si: °Tiene calambres/contracciones que no desaparecen con agua potable, especialmente si duran de 30 segundos a 1,5 minutos, aparecen y desaparecen cada 5-10 minutos durante una hora o más, o si son cada vez más fuertes y no puede caminar o hablar mientras tener una contracción/calambre. °Tu agua se rompe. A veces es un gran chorro de líquido, a veces es solo un goteo que sigue mojando tu ropa interior o corriendo por tus piernas. °Tiene sangrado vaginal. °No siente que su bebé se mueva normalmente. Si no lo hace, busque algo para comer y beber (algo frío o algo con azúcar como mantequilla de maní o jugo) y acuéstese y concéntrese en sentir cómo se mueve su bebé. Si su bebé todavía no se mueve con normalidad, debe ir a MAU. Debe sentir que su bebé se mueve 6 veces en una hora o 10 veces en dos horas. °Tiene un dolor de cabeza persistente que no desaparece con 1 g de Tylenol, cambios en la visión, dolor en el pecho, dificultad para respirar, dolor intenso en la parte superior derecha del abdomen, empeoramiento de la hinchazón de las piernas; todos estos pueden ser signos de presión arterial alta en el embarazo y necesita para ser evaluado por un proveedor inmediatamente ° °Todos estos son preocupantes durante el embarazo y, si tiene alguno de estos, le recomiendo que llame a su PCP y se presente a la Unidad de Admisiones de Maternidad (mapa a continuación) para una evaluación adicional. ° °Para cualquier emergencia relacionada con el embarazo, diríjase a la Unidad de Admisiones de Maternidad en el [facility]. Usará la Entrada C del hospital. ° °  °

## 2021-04-08 NOTE — Progress Notes (Signed)
?  [place] Prenatal Visit ? ?Tammy Cherry is a 26 y.o. G3P2002 at 76w5dhere for routine follow up. She is dated by LMP.  She reports no complaints. She reports fetal movement. Denies vaginal bleeding, loss of fluid, or contractions.  See flow sheet for details. ? ?A/P: Pregnancy at 282w5d Doing well.   ?Dating reviewed, dating tab is correct ?Fetal heart tones Appropriate ?Fundal height within expected range.  ?Influenza vaccine previously administered.   ?COVID vaccination was discussed and previously amdinistered.  ?Screening for gestational diabetes Not completed due at next OB visit. . Even if prior 1 hour test completed, if before 24 weeks, repeat. Screening before 24 weeks is screening for preexisting DM  ?Pregnancy education completed including: fetal growth, breastfeeding, contraception, and expected weight gain in pregnancy.   ?The patient does not have a history of Cesarean delivery and no referral to [facility] indicated ?Need to Schedule for [facility] during third  trimester on Next Visit. ?Preterm labor, bleeding, and pain precautions given.  ? ? ?2. Pregnancy issues include the following and were addressed as appropriate today: ? ? PHQ-9 was 1 ?      Rubella Non-Immune, will need MMR postpartum ?      UTI during last visit, treated, TOC today ?      History of SGA infants x 2 with 5-7d NICU stay ?      Baby estimated LGA, > 97% @ 24 wks ?      Baby fetal [place] show spine or heart. [facility] [facility] in early April, need to call at end of March to schedule. Reminder sent to Dr. PrThompson Grayernd DeDelray Alt ? Problem list and pregnancy box updated: Yes.  ? ?Follow up 2 weeks. ? ? ?

## 2021-04-11 LAB — URINE CULTURE, OB REFLEX

## 2021-04-11 LAB — CULTURE, OB URINE

## 2021-04-14 ENCOUNTER — Other Ambulatory Visit: Payer: Self-pay | Admitting: Family Medicine

## 2021-04-14 ENCOUNTER — Telehealth: Payer: Self-pay | Admitting: Family Medicine

## 2021-04-14 DIAGNOSIS — O99891 Other specified diseases and conditions complicating pregnancy: Secondary | ICD-10-CM

## 2021-04-14 MED ORDER — CEPHALEXIN 500 MG PO CAPS: 500.0000 mg | ORAL_CAPSULE | Freq: Four times a day (QID) | ORAL | 0 refills | Status: AC

## 2021-04-14 NOTE — Telephone Encounter (Signed)
Called patient with Spanish speaking interpretor. Left VM stating Urine culture showing Klebsiella pneumoniae asymptomatic bacteruria again. Cephalexin 500mg  QID x 5days sent to pharmacy. Left clinic number tocall back. ? ? Dayshia Ballinas MD ?

## 2021-04-24 NOTE — Progress Notes (Addendum)
  [place] Prenatal Visit  Tammy Cherry is a 26 y.o. G3P2002 at 20w2dhere for routine follow up. She is dated by LMP.  She reports no complaints. She reports fetal movement. She denies vaginal bleeding, contractions, or loss of fluid. See flow sheet for details.  Vitals:   04/26/21 1350  BP: 133/80  Pulse: 100     A/P: Pregnancy at 289w2d Doing well.   Routine prenatal care:  Dating reviewed, dating tab is correct Fetal heart tones Appropriate Fundal height within expected range.  Infant feeding choice: Breastfeeding Contraception choice: Nexplanon  Infant circumcision desired not applicable  The patient does not have a history of Cesarean delivery and no [facility] is indicated Influenza vaccine previously administered.   Tdap was not given today but letter provided to take to the Health Department to obtain vaccine.  1 hour glucola, CBC, RPR, and HIV were obtained today.    Rh status was reviewed and patient does not need Rhogam.  Rhogam was not given today.  Pregnancy medical home were PHQ-9 forms were done today and reviewed.   Childbirth and education classes were offered. Pregnancy education regarding benefits of breastfeeding, contraception, fetal growth, expected weight gain, and safe infant sleep were discussed.  Preterm labor and fetal movement precautions reviewed.  2. Pregnancy issues include the following and were addressed as appropriate today:  LGA fetus with EFW >97% on 03/25/21; repeat U/S on 4/5  CBC, HIV, and RPR collected today Unable to view spine/heart on previous anatomy U/S, follow up U/S as above   Rubella non-immune, offer MMR post-partum  Klebsiella UTI x1 tx with Keflex x2. Will repeat OB urine culture today  Started on 81 mg ASA for previous complication in pregnancy with infant spending time in NICU for SGA. Also SES. BP 133/80 today, will need to continue to be monitored.  Letter for TDAP  provided to take to health department  Failed 1-hour Glucola today 136, patient scheduled for 3- hour lab only appointment on 4/10.  Problem list and pregnancy box updated: . Marland Kitchen Follow up 2 weeks.

## 2021-04-24 NOTE — Patient Instructions (Addendum)
Fue maravilloso verte hoy. ? ?Por favor traiga TODOS sus medicamentos a cada visita. ? ?Hoy hablamos de: ? ?Tome una aspirina de 81 mg al d?a. ? ?Por favor vaya a su cita de [employer]. ? ?Por favor vaya al Departamento de Salud para su vacuna TDAP. ? ?Si experimenta sangrado vaginal, p?rdida de l?quidos, no siente que su beb? se mueva tanto o comienza a Technical brewer con menos de 5 minutos de diferencia, vaya directamente a la Unidad de Evaluaci?n Materna en [facility] para una evaluaci?n. ? ?Servicios de atenci?n de mujeres y maternidad ubicados en el lado sur de [place]. [facility] (Entrada C en [address] ?[address] ?[place], [place] [address] ? ?  ? ?Karl Pock por elegir Medicina familiar de [place]. ? ?Llame al [phone] si tiene alguna pregunta sobre la cita de hoy. ? ?Aseg?rese de programar un seguimiento en la recepci?n antes de irse hoy. ? ?Sabino Dick, D.O. ?PGY-2 Medicina Familiar  ?

## 2021-04-26 ENCOUNTER — Other Ambulatory Visit: Payer: Self-pay | Admitting: Family Medicine

## 2021-04-26 ENCOUNTER — Other Ambulatory Visit: Payer: Self-pay

## 2021-04-26 ENCOUNTER — Other Ambulatory Visit: Payer: Self-pay | Admitting: *Deleted

## 2021-04-26 ENCOUNTER — Ambulatory Visit (INDEPENDENT_AMBULATORY_CARE_PROVIDER_SITE_OTHER): Payer: Self-pay | Admitting: Family Medicine

## 2021-04-26 VITALS — BP 133/80 | HR 100 | Wt 149.0 lb

## 2021-04-26 DIAGNOSIS — N3000 Acute cystitis without hematuria: Secondary | ICD-10-CM

## 2021-04-26 DIAGNOSIS — R7309 Other abnormal glucose: Secondary | ICD-10-CM | POA: Insufficient documentation

## 2021-04-26 DIAGNOSIS — Z3480 Encounter for supervision of other normal pregnancy, unspecified trimester: Secondary | ICD-10-CM

## 2021-04-26 LAB — POCT 1 HR PRENATAL GLUCOSE: Glucose 1 Hr Prenatal, POC: 136 mg/dL

## 2021-04-26 MED ORDER — ASPIRIN EC 81 MG PO TBEC: 81.0000 mg | DELAYED_RELEASE_TABLET | Freq: Every day | ORAL | 11 refills | Status: DC

## 2021-04-27 LAB — CBC
Hematocrit: 32.8 % — ABNORMAL LOW (ref 34.0–46.6)
Hemoglobin: 10.2 g/dL — ABNORMAL LOW (ref 11.1–15.9)
MCH: 23.8 pg — ABNORMAL LOW (ref 26.6–33.0)
MCHC: 31.1 g/dL — ABNORMAL LOW (ref 31.5–35.7)
MCV: 77 fL — ABNORMAL LOW (ref 79–97)
Platelets: 339 10*3/uL (ref 150–450)
RBC: 4.28 x10E6/uL (ref 3.77–5.28)
RDW: 13.4 % (ref 11.7–15.4)
WBC: 12 10*3/uL — ABNORMAL HIGH (ref 3.4–10.8)

## 2021-04-27 LAB — HIV ANTIBODY (ROUTINE TESTING W REFLEX): HIV Screen 4th Generation wRfx: NONREACTIVE

## 2021-04-27 LAB — RPR: RPR Ser Ql: NONREACTIVE

## 2021-04-29 LAB — URINE CULTURE, OB REFLEX

## 2021-04-29 LAB — CULTURE, OB URINE

## 2021-04-30 ENCOUNTER — Other Ambulatory Visit: Payer: Self-pay | Admitting: Family Medicine

## 2021-04-30 MED ORDER — SULFAMETHOXAZOLE-TRIMETHOPRIM 800-160 MG PO TABS: 1.0000 | ORAL_TABLET | Freq: Two times a day (BID) | ORAL | 0 refills | Status: AC

## 2021-04-30 NOTE — Progress Notes (Signed)
Will treat with Bactrim DS x3 days for patients Klebsiella UTI. Has already completed 2 courses of Keflex. TOC to be done at next OB visit on 4/17. Patient is aware of lab appointment on 4/10. ?

## 2021-05-03 ENCOUNTER — Other Ambulatory Visit (INDEPENDENT_AMBULATORY_CARE_PROVIDER_SITE_OTHER): Payer: Self-pay

## 2021-05-03 ENCOUNTER — Other Ambulatory Visit: Payer: Self-pay | Admitting: Family Medicine

## 2021-05-03 DIAGNOSIS — Z3480 Encounter for supervision of other normal pregnancy, unspecified trimester: Secondary | ICD-10-CM

## 2021-05-03 MED ORDER — ONDANSETRON HCL 4 MG PO TABS: 4.0000 mg | ORAL_TABLET | Freq: Once | ORAL | 0 refills | Status: AC

## 2021-05-03 NOTE — Progress Notes (Signed)
Patient became sick after drinking glucola for 3 hr gtt. Test cancelled per protocol. MD notified. Doris Cheadle Kaycen Whitworth ? ?

## 2021-05-04 ENCOUNTER — Other Ambulatory Visit: Payer: Self-pay | Admitting: Family Medicine

## 2021-05-04 DIAGNOSIS — Z3480 Encounter for supervision of other normal pregnancy, unspecified trimester: Secondary | ICD-10-CM

## 2021-05-06 NOTE — Progress Notes (Signed)
?  [place] Prenatal Visit ? ?Tammy Cherry is a 26 y.o. G3P2002 at [redacted]w[redacted]d here for routine follow up. She is dated by LMP.  She reports no complaints. She reports fetal movement. She denies vaginal bleeding, contractions, or loss of fluid. See flow sheet for details. ? ?Vitals:  ? 05/10/21 1434  ?BP: 109/77  ?Pulse: 87  ? ? ?A/P: Pregnancy at [redacted]w[redacted]d.  Doing well.   ?Routine prenatal care:  ?Dating reviewed, dating tab is correct ?Fetal heart tones Appropriate ?Fundal height within expected range.  ?Infant feeding choice: Breastfeeding ?Contraception choice: Nexplanon  outpatient ?Infant circumcision desired not applicable ? ?The patient does not have a history of Cesarean delivery and no referral to [facility] [place] is indicated ?Influenza vaccine previously administered.   ?Tdap was previously done.   ?1 hour glucola, CBC, RPR, and HIV were previously obtained and notable for microcytic anemia with Hgb 10.2. Likely iron deficiency.   ? ?Rh status was reviewed and patient does not need Rhogam.  Rhogam was not given today.  ?PHQ-9 forms was done today and reviewed.   ?Childbirth and education classes was previously offered. ?Pregnancy education regarding benefits of breastfeeding, contraception, fetal growth, expected weight gain, and safe infant sleep were discussed.  ?Preterm labor and fetal movement precautions reviewed. ? ?2. Pregnancy issues include the following and were addressed as appropriate today: ? ?History of UTI x3 this pregnancy.  Has completed 2 courses of Keflex and most recently completed a 3-day course of Bactrim DS. TOC today.  ?History of LGA fetus with EFW >97%. Repeat U/S 4/5 shows EFW 78.6%. Normal 4-chamber heart and spine seen.  ?Rubella non-immune. Offer MMR post-partum.  ?Tdap received at HD.   ?On ASA for SES and previous pregnancy with NICU stay.  ?Hemoglobin 10.2, MCV 77. Start oral iron supplements. ?Failed 1-hour GTT. Had 3-hour GTT earlier this  AM. Follow up results. ?Problem list and pregnancy box updated: Yes.  ? ?Patient scheduled in [facility] during third trimester on .  ? ?Follow up 2 weeks. ? ?

## 2021-05-06 NOTE — Patient Instructions (Signed)
Fue maravilloso verte hoy.  Por favor traiga TODOS sus medicamentos a cada visita.  Hoy hablamos de:  Si experimenta sangrado vaginal, prdida de lquidos, no siente que su beb se mueva tanto o comienza a tener contracciones con menos de 5 minutos de diferencia, vaya directamente a la Unidad de Evaluacin Materna en el [facility] para una evaluacin.  Servicios de atencin de mujeres y maternidad ubicados en el lado sur de [facility]. [facility] (Entr[address] en [address]). [address] [place], [place] [address]     Gracias por elegir Medicina familiar de [place].  Llame al [phone] si tiene alguna pregunta sobre la cita de hoy.  Asegrese de programar un seguimiento en la recepcin antes de irse hoy.  Quynh Basso, D.O. PGY-2 Medicina Familiar  

## 2021-05-10 ENCOUNTER — Ambulatory Visit (INDEPENDENT_AMBULATORY_CARE_PROVIDER_SITE_OTHER): Payer: Self-pay | Admitting: Family Medicine

## 2021-05-10 ENCOUNTER — Other Ambulatory Visit (INDEPENDENT_AMBULATORY_CARE_PROVIDER_SITE_OTHER): Payer: Self-pay

## 2021-05-10 VITALS — BP 109/77 | HR 87 | Wt 151.0 lb

## 2021-05-10 DIAGNOSIS — O99891 Other specified diseases and conditions complicating pregnancy: Secondary | ICD-10-CM

## 2021-05-10 DIAGNOSIS — Z3480 Encounter for supervision of other normal pregnancy, unspecified trimester: Secondary | ICD-10-CM

## 2021-05-10 DIAGNOSIS — R8271 Bacteriuria: Secondary | ICD-10-CM

## 2021-05-10 LAB — POCT CBG (FASTING - GLUCOSE)-MANUAL ENTRY: Glucose Fasting, POC: 85 mg/dL (ref 70–99)

## 2021-05-10 MED ORDER — FERROUS SULFATE 325 (65 FE) MG PO TABS: 325.0000 mg | ORAL_TABLET | ORAL | 3 refills | Status: AC

## 2021-05-10 NOTE — Addendum Note (Signed)
Addended by: Sabino Dick on: 05/10/2021 02:50 PM ? ? Modules accepted: Orders ? ?

## 2021-05-11 LAB — GESTATIONAL GLUCOSE TOLERANCE
Glucose, Fasting: 71 mg/dL (ref 70–94)
Glucose, GTT - 1 Hour: 131 mg/dL (ref 70–179)
Glucose, GTT - 2 Hour: 98 mg/dL (ref 70–154)
Glucose, GTT - 3 Hour: 109 mg/dL (ref 70–139)

## 2021-05-12 LAB — CULTURE, OB URINE

## 2021-05-12 LAB — URINE CULTURE, OB REFLEX

## 2021-05-18 NOTE — Patient Instructions (Addendum)
Fue maravilloso verte hoy. ? ?Por favor traiga TODOS sus medicamentos a cada visita. ? ?Hoy hablamos de: ? ?Estoy enviando un medicamento llamado famotidina, o Pepcid, para ayudar con los s?ntomas del reflujo ?cido. Puede tomar [employer] veces al d?a. ? ?Regrese si tiene [employer] o v?mitos persistentes o que empeoran. Aseg?rese de comer comidas regulares y mantenerse hidratado, ?especialmente con el clima cada vez m?s c?lido! ? ?Si experimenta sangrado vaginal, p?rdida de l?quidos, no siente que su beb? se mueva tanto o comienza a [employer] con menos de 5 minutos de diferencia, vaya directamente a la Unidad de Evaluaci?n Materna en [facility] para una evaluaci?n. ? ?Servicios de atenci?n de mujeres y maternidad ubicados en el lado sur de [place]. [facility] (Entrada C en [address] ?[address] [place] [address] ?[place], [place] [address] ? ?  ? ?[place] por elegir Medicina familiar de [place]. ? ?Llame al [phone] si tiene alguna pregunta sobre la cita de hoy. ? ?Aseg?rese de programar un seguimiento en la recepci?n antes de irse hoy. ? ?Sabino Dick, D.O. ?PGY-2 Medicina Familiar  ?

## 2021-05-18 NOTE — Progress Notes (Signed)
?  [place] Prenatal Visit ? ?Tammy Cherry is a 26 y.o. G3P2002 at [redacted]w[redacted]d here for routine follow up. She is dated by LMP.  She reports intermittent lower abdominal pain that she can only describe as "strong". It comes and goes, is short lasting and does not feel like a contraction. She also intermittent sensations of dizziness and vomiting. Last episode of dizziness occurred today but otherwise is unable to quantify how often they happen- just state it is on and off.  She reports fetal movement. She denies vaginal bleeding, contractions, or loss of fluid.  See flow sheet for details. ? ?Vitals:  ? 05/28/21 1435  ?BP: 132/76  ?Pulse: (!) 101  ? ?BP recheck: 114/50 ? ?A/P: Pregnancy at [redacted]w[redacted]d.  Doing well.   ?Routine prenatal care:  ?Dating reviewed, dating tab is correct ?Fetal heart tones: Appropriate ?Fundal height: within expected range.  ?The patient does not have a history of HSV and valacyclovir is not indicated at this time.  ?The patient does not have a history of Cesarean delivery and no referral to [facility] is indicated ?Infant feeding choice: Breastfeeding ?Contraception choice: Nexplanon outpatient ?Infant circumcision desired not applicable ?Influenza vaccine previously administered.   ?Tdap was previously administered.  ?COVID vaccination was discussed and previously received.  ?Childbirth and education classes were offered and patient declined. ?Pregnancy education regarding benefits of breastfeeding, contraception, fetal growth, expected weight gain, and safe infant sleep were discussed.  ?Preterm labor and fetal movement precautions reviewed. ? ?2. Pregnancy issues include the following and were addressed as appropriate today: ?History of UTI x3 this pregnancy. Most recent Ucx was negative. Obtaining repeat Ucx today given lower abdominal pain complaints. ?BP slightly elevated on initial check but improved on repeat. On ASA.  ?History of LGA fetus with  EFW >97%. Repeat U/S 4/5 shows EFW 78.6%. Normal 4-chamber heart and spine seen.  ?Rubella non-immune. Offer MMR post-partum.  ?On ASA for SES and previous pregnancy with NICU stay.  ?Iron deficiency anemia- on iron supplements. ?Failed 1-hour GTT. 3-hr GTT normal.  ?Dizziness, intermittent vomiting and lower abdominal pain: denies sensation of contractions. Does endorse some reflux symptoms- will trial Pepcid. In regards to her dizziness, POCT hgb checked and 9.5 (10.2 one month ago). She is compliant on iron supplements. Does not have insurance which could be barrier to IV iron supplementation. Also concern for dehydration and encouraged regular meals and increased hydration. Given episodes are intermittent and are short lasting- feel this is okay to monitor for now but discussed that patient should return if symptoms persist or worsen.  ?Problem list and pregnancy box updated: Yes.  ? ?Scheduled for [facility] in third trimester on 6/1.  ? ?Follow up 2 weeks. ? ?

## 2021-05-26 ENCOUNTER — Encounter: Payer: Self-pay | Admitting: Family Medicine

## 2021-05-28 ENCOUNTER — Other Ambulatory Visit: Payer: Self-pay

## 2021-05-28 ENCOUNTER — Ambulatory Visit (INDEPENDENT_AMBULATORY_CARE_PROVIDER_SITE_OTHER): Payer: Self-pay | Admitting: Family Medicine

## 2021-05-28 VITALS — BP 114/50 | HR 101 | Wt 149.8 lb

## 2021-05-28 DIAGNOSIS — Z3480 Encounter for supervision of other normal pregnancy, unspecified trimester: Secondary | ICD-10-CM

## 2021-05-28 DIAGNOSIS — R1030 Lower abdominal pain, unspecified: Secondary | ICD-10-CM

## 2021-05-28 LAB — POCT HEMOGLOBIN: Hemoglobin: 9.5 g/dL — AB (ref 11–14.6)

## 2021-05-28 MED ORDER — FAMOTIDINE 20 MG PO TABS: 20.0000 mg | ORAL_TABLET | Freq: Two times a day (BID) | ORAL | 1 refills | Status: DC

## 2021-05-31 LAB — URINE CULTURE, OB REFLEX

## 2021-05-31 LAB — CULTURE, OB URINE

## 2021-06-01 ENCOUNTER — Other Ambulatory Visit: Payer: Self-pay | Admitting: Family Medicine

## 2021-06-01 ENCOUNTER — Telehealth: Payer: Self-pay | Admitting: Family Medicine

## 2021-06-01 MED ORDER — CEPHALEXIN 500 MG PO CAPS: 500.0000 mg | ORAL_CAPSULE | Freq: Four times a day (QID) | ORAL | 0 refills | Status: AC

## 2021-06-01 MED ORDER — CEPHALEXIN 250 MG PO CAPS: 250.0000 mg | ORAL_CAPSULE | Freq: Every evening | ORAL | 0 refills | Status: DC

## 2021-06-01 NOTE — Progress Notes (Signed)
Sending keflex 500 mg QID x 7 days.  ?

## 2021-06-01 NOTE — Telephone Encounter (Signed)
Called patient to discuss her urine culture results. This is now her 4th UTI this pregnancy. Shared need for treatment and suppression at this time. Rx for keflex 500 mg QID x 7 days, this is to be followed by keflex 250 mg nightly for the remainder of pregnancy. ? ?Patient also discussed how she was feeling under the weather. Felt like she may have a fever and her ears hurt. She has been taking Tylenol for this. Offered to make patient an appointment to be seen, however, she opts to continue Tylenol first to see if it helps. If symptoms don't improve or worsen, she will schedule an appointment.  ?

## 2021-06-08 NOTE — Progress Notes (Signed)
  [place] Prenatal Visit  Tammy Cherry Tammy Cherry is a 26 y.o. G3P2002 at [redacted]w[redacted]d here for routine follow up. She is dated by LMP.  She reports no complaints.  She reports fetal movement. She denies vaginal bleeding, contractions, or loss of fluid.  See flow sheet for details.  Vitals:   06/11/21 1438  BP: 114/71  Pulse: 84    A/P: Pregnancy at [redacted]w[redacted]d.  Doing well.   Routine prenatal care:  Dating reviewed, dating tab is correct Fetal heart tones: Appropriate Fundal height: within expected range.  The patient does not have a history of HSV and valacyclovir is not indicated at this time.  The patient does not have a history of Cesarean delivery and no referral to [facility] is indicated Infant feeding choice: Breastfeeding and pumping  Contraception choice: Nexplanon outpatient or HD Infant circumcision desired not applicable Influenza vaccine not administered as not influenza season.   Tdap was previously given at HD. COVID vaccination was discussed and patient reports having had two shots. Declines booster  Childbirth and education classes were offered. Pregnancy education regarding benefits of breastfeeding, contraception, fetal growth, expected weight gain, and safe infant sleep were discussed.  Preterm labor and fetal movement precautions reviewed.  2. Pregnancy issues include the following and were addressed as appropriate today: History of UTI x4 this pregnancy, now on suppressive therapy with keflex 250 mg daily. Repeat Urine Cx today. Rubella non-immune. Offer MMR post-partum.  On ASA for SES and previous pregnancy with NICU stay.  Iron deficiency anemia- on iron supplements Pepcid refilled today. Problem list and pregnancy box updated: Yes.   Scheduled for [facility] in third trimester on 6/1.   Follow up 2 weeks.

## 2021-06-11 ENCOUNTER — Ambulatory Visit (INDEPENDENT_AMBULATORY_CARE_PROVIDER_SITE_OTHER): Payer: Self-pay | Admitting: Family Medicine

## 2021-06-11 DIAGNOSIS — O99891 Other specified diseases and conditions complicating pregnancy: Secondary | ICD-10-CM

## 2021-06-11 DIAGNOSIS — R7309 Other abnormal glucose: Secondary | ICD-10-CM

## 2021-06-11 DIAGNOSIS — R8271 Bacteriuria: Secondary | ICD-10-CM

## 2021-06-11 DIAGNOSIS — Z3493 Encounter for supervision of normal pregnancy, unspecified, third trimester: Secondary | ICD-10-CM

## 2021-06-11 MED ORDER — FAMOTIDINE 20 MG PO TABS: 20.0000 mg | ORAL_TABLET | Freq: Two times a day (BID) | ORAL | 1 refills | Status: DC

## 2021-06-11 NOTE — Patient Instructions (Signed)
Fue maravilloso verte hoy.  Por favor traiga TODOS sus medicamentos a cada visita.  Hoy hablamos de:  Si experimenta sangrado vaginal, prdida de lquidos, no siente que su beb se mueva tanto o comienza a tener contracciones con menos de 5 minutos de diferencia, vaya directamente a la Unidad de Evaluacin Materna en el Hospital  para una evaluacin.  Servicios de atencin de mujeres y maternidad ubicados en el lado sur de [facility] [place]. [facility] (Entr[address] en [address]). [address] [place], [place] [address]     Gracias por elegir Medicina familiar de [place].  Llame al [phone] si tiene alguna pregunta sobre la cita de hoy.  Asegrese de programar un seguimiento en la recepcin antes de irse hoy.  Chrishonda Hesch, D.O. PGY-2 Medicina Familiar  

## 2021-06-13 ENCOUNTER — Inpatient Hospital Stay (HOSPITAL_COMMUNITY)
Admission: AD | Admit: 2021-06-13 | Discharge: 2021-06-15 | DRG: 833 | Disposition: A | Discharge status: Home / Self Care | Payer: Medicaid Other | Attending: Obstetrics and Gynecology | Admitting: Obstetrics and Gynecology

## 2021-06-13 ENCOUNTER — Inpatient Hospital Stay (HOSPITAL_COMMUNITY): Payer: Medicaid Other

## 2021-06-13 DIAGNOSIS — O99013 Anemia complicating pregnancy, third trimester: Secondary | ICD-10-CM | POA: Diagnosis present

## 2021-06-13 DIAGNOSIS — Z7982 Long term (current) use of aspirin: Secondary | ICD-10-CM | POA: Diagnosis not present

## 2021-06-13 DIAGNOSIS — Z3A35 35 weeks gestation of pregnancy: Secondary | ICD-10-CM

## 2021-06-13 DIAGNOSIS — Z20822 Contact with and (suspected) exposure to covid-19: Secondary | ICD-10-CM | POA: Diagnosis present

## 2021-06-13 DIAGNOSIS — O2303 Infections of kidney in pregnancy, third trimester: Secondary | ICD-10-CM | POA: Diagnosis present

## 2021-06-13 DIAGNOSIS — Z603 Acculturation difficulty: Secondary | ICD-10-CM | POA: Diagnosis present

## 2021-06-13 DIAGNOSIS — Z789 Other specified health status: Secondary | ICD-10-CM | POA: Diagnosis present

## 2021-06-13 DIAGNOSIS — R509 Fever, unspecified: Secondary | ICD-10-CM | POA: Diagnosis present

## 2021-06-13 DIAGNOSIS — O2300 Infections of kidney in pregnancy, unspecified trimester: Principal | ICD-10-CM | POA: Diagnosis present

## 2021-06-13 HISTORY — DX: Calculus of gallbladder without cholecystitis without obstruction: K80.20

## 2021-06-13 LAB — CBC WITH DIFFERENTIAL/PLATELET
Abs Immature Granulocytes: 0.37 10*3/uL — ABNORMAL HIGH (ref 0.00–0.07)
Basophils Absolute: 0.1 10*3/uL (ref 0.0–0.1)
Basophils Relative: 0 %
Eosinophils Absolute: 0 10*3/uL (ref 0.0–0.5)
Eosinophils Relative: 0 %
HCT: 28.8 % — ABNORMAL LOW (ref 36.0–46.0)
Hemoglobin: 9.3 g/dL — ABNORMAL LOW (ref 12.0–15.0)
Immature Granulocytes: 1 %
Lymphocytes Relative: 5 %
Lymphs Abs: 1.4 10*3/uL (ref 0.7–4.0)
MCH: 23.3 pg — ABNORMAL LOW (ref 26.0–34.0)
MCHC: 32.3 g/dL (ref 30.0–36.0)
MCV: 72 fL — ABNORMAL LOW (ref 80.0–100.0)
Monocytes Absolute: 1.9 10*3/uL — ABNORMAL HIGH (ref 0.1–1.0)
Monocytes Relative: 7 %
Neutro Abs: 22.2 10*3/uL — ABNORMAL HIGH (ref 1.7–7.7)
Neutrophils Relative %: 87 %
Platelets: 321 10*3/uL (ref 150–400)
RBC: 4 MIL/uL (ref 3.87–5.11)
RDW: 15.6 % — ABNORMAL HIGH (ref 11.5–15.5)
WBC: 25.9 10*3/uL — ABNORMAL HIGH (ref 4.0–10.5)
nRBC: 0 % (ref 0.0–0.2)

## 2021-06-13 LAB — RESP PANEL BY RT-PCR (FLU A&B, COVID) ARPGX2
Influenza A by PCR: NEGATIVE
Influenza B by PCR: NEGATIVE
SARS Coronavirus 2 by RT PCR: NEGATIVE

## 2021-06-13 LAB — COMPREHENSIVE METABOLIC PANEL
ALT: 11 U/L (ref 0–44)
AST: 16 U/L (ref 15–41)
Albumin: 2.7 g/dL — ABNORMAL LOW (ref 3.5–5.0)
Alkaline Phosphatase: 212 U/L — ABNORMAL HIGH (ref 38–126)
Anion gap: 11 (ref 5–15)
BUN: 6 mg/dL (ref 6–20)
CO2: 18 mmol/L — ABNORMAL LOW (ref 22–32)
Calcium: 8.5 mg/dL — ABNORMAL LOW (ref 8.9–10.3)
Chloride: 105 mmol/L (ref 98–111)
Creatinine, Ser: 0.75 mg/dL (ref 0.44–1.00)
GFR, Estimated: >60 mL/min (ref >60)
Glucose, Bld: 98 mg/dL (ref 70–99)
Potassium: 3.4 mmol/L — ABNORMAL LOW (ref 3.5–5.1)
Sodium: 134 mmol/L — ABNORMAL LOW (ref 135–145)
Total Bilirubin: 0.5 mg/dL (ref 0.3–1.2)
Total Protein: 6.7 g/dL (ref 6.5–8.1)

## 2021-06-13 LAB — URINALYSIS, ROUTINE W REFLEX MICROSCOPIC
Bilirubin Urine: NEGATIVE
Glucose, UA: NEGATIVE mg/dL
Ketones, ur: NEGATIVE mg/dL
Nitrite: POSITIVE — AB
Protein, ur: 30 mg/dL — AB
Specific Gravity, Urine: 1.01 (ref 1.005–1.030)
WBC, UA: >50 WBC/hpf — ABNORMAL HIGH (ref 0–5)
pH: 6 (ref 5.0–8.0)

## 2021-06-13 LAB — TYPE AND SCREEN
ABO/RH(D): O POS
Antibody Screen: NEGATIVE

## 2021-06-13 MED ORDER — LACTATED RINGERS IV BOLUS
1000.0000 mL | Freq: Once | INTRAVENOUS | Status: AC
  Administered 2021-06-13: 1000 mL via INTRAVENOUS

## 2021-06-13 MED ORDER — ACETAMINOPHEN 500 MG PO TABS
1000.0000 mg | ORAL_TABLET | Freq: Once | ORAL | Status: AC
  Administered 2021-06-13: 1000 mg via ORAL
  Filled 2021-06-13: qty 2

## 2021-06-13 MED ORDER — PRENATAL MULTIVITAMIN CH
1.0000 | ORAL_TABLET | Freq: Every day | ORAL | Status: DC
  Administered 2021-06-14 – 2021-06-15 (×2): 1 via ORAL
  Filled 2021-06-13 (×2): qty 1

## 2021-06-13 MED ORDER — DOCUSATE SODIUM 100 MG PO CAPS: 100.0000 mg | ORAL_CAPSULE | Freq: Two times a day (BID) | ORAL | Status: DC | PRN

## 2021-06-13 MED ORDER — SODIUM CHLORIDE 0.9 % IV SOLN
2.0000 g | INTRAVENOUS | Status: DC
  Administered 2021-06-13 – 2021-06-14 (×2): 2 g via INTRAVENOUS
  Filled 2021-06-13 (×2): qty 20

## 2021-06-13 MED ORDER — LACTATED RINGERS IV SOLN: INTRAVENOUS | Status: DC

## 2021-06-13 MED ORDER — CALCIUM CARBONATE ANTACID 500 MG PO CHEW: 2.0000 | CHEWABLE_TABLET | ORAL | Status: DC | PRN

## 2021-06-13 MED ORDER — ACETAMINOPHEN 325 MG PO TABS
650.0000 mg | ORAL_TABLET | Freq: Four times a day (QID) | ORAL | Status: DC | PRN
  Filled 2021-06-13: qty 2

## 2021-06-13 MED ORDER — ACETAMINOPHEN 325 MG PO TABS: 650.0000 mg | ORAL_TABLET | ORAL | Status: DC | PRN

## 2021-06-13 NOTE — MAU Note (Signed)
.  Tammy Cherry [name] is a 26 y.o. at [redacted]w[redacted]d here in MAU reporting: fever, generalized body ache/bone sore and pain, HA, and chills since Friday. Pt reports has take her temperature 100.1 F, but feels "hot". Pt reports having lower left back pain since Friday. Pt states her back feels tight and makes it difficult for her to breathe deeply.  Pt denies being exposed to COVID or FLU, or other illness. Pt also reports seeing floaters today. Pt has taken Tylenol 1000mg  every day since Friday with no relief.Pt states only been drinking fluids, no meals since breakfast. Pt denies DFM, VB, LOF, CTX, and abnormal discharge. Pt reports having UTIs during this pregnancy.   Pt was tachycardiac in triage 140s  Onset of complaint: Friday Pain score: 10/10 back Vitals:   06/13/21 2114  BP: (!) 103/42  Pulse: (!) 143  Resp: 16  Temp: (!) 102.9 F (39.4 C)  SpO2: 97%     FHT:180 Lab orders placed from triage:  UA

## 2021-06-13 NOTE — H&P (Signed)
Obstetrics Admission History & Physical  06/13/2021 - 10:00 PM [facility]  Chief Complaint: fevers, chills  History of Present Illness  26 y.o. [id number] @ [redacted]w[redacted]d, with the above CC. Pregnancy complicated by: recurrent klebsiella UTIs.  [name] states that she had the above s/s today; no OB or respiratory s/s. +left back pain. She had a UCx that came back >100k GNR on 5/19, no abx called in. She had 5/5 >100k klebsiella UTI as well as a few others this pregnancy that have all been susceptible to rocephin; she is currently on keflex 250 qhs suppression  Review of Systems: as noted in the History of Present Illness.  Patient Active Problem List   Diagnosis Date Noted   Pyelonephritis affecting pregnancy 06/13/2021   Language barrier 06/13/2021   Glucose tolerance test abnormal 04/26/2021   Supervision of normal pregnancy 03/08/2021   Rubella non-immune status, antepartum 03/04/2021   Asymptomatic bacteriuria during pregnancy 03/04/2021     PMHx: No past medical history on file. PSHx: No past surgical history on file. Medications:  Medications Prior to Admission  Medication Sig Dispense Refill Last Dose   aspirin EC 81 MG tablet Take 1 tablet (81 mg total) by mouth daily. Swallow whole. 30 tablet 11 06/13/2021   ferrous sulfate 325 (65 FE) MG tablet Take 1 tablet (325 mg total) by mouth every other day. 30 tablet 3 06/13/2021   Prenatal Vit-Fe Fumarate-FA (MULTIVITAMIN-PRENATAL) 27-0.8 MG TABS tablet Take 1 tablet by mouth daily at 12 noon.   06/13/2021   cephALEXin (KEFLEX) 250 MG capsule Take 1 capsule (250 mg total) by mouth at bedtime. 40 capsule 0    cyclobenzaprine (FLEXERIL) 10 MG tablet Take 1 tablet (10 mg total) by mouth 3 (three) times daily as needed for muscle spasms. (Patient not taking: Reported on 05/28/2021) 20 tablet 0    famotidine (PEPCID) 20 MG tablet Take 1 tablet (20 mg total) by mouth 2 (two) times daily. 30 tablet 1       Allergies: has no allergies on file. OBHx:  OB History  Gravida Para Term Preterm AB Living  3 2 2     2   SAB IAB Ectopic Multiple Live Births          2    # Outcome Date GA Lbr Len/2nd Weight Sex Delivery Anes PTL Lv  3 Current           2 Term 09/20/16 [redacted]w[redacted]d   M Vag-Spont   LIV  1 Term 02/27/13 [redacted]w[redacted]d   M Vag-Spont   LIV         FHx: No family history on file. Soc Hx:  Social History   Socioeconomic History   Marital status: Significant Other    Spouse name: Not on file   Number of children: Not on file   Years of education: Not on file   Highest education level: Not on file  Occupational History   Not on file  Tobacco Use   Smoking status: Never   Smokeless tobacco: Never  Vaping Use   Vaping Use: Never used  Substance and Sexual Activity   Alcohol use: Never   Drug use: Never   Sexual activity: Yes    Birth control/protection: None  Other Topics Concern   Not on file  Social History Narrative   Not on file   Social Determinants of Health   Financial Resource Strain: Not on file  Food Insecurity: Not on file  Transportation Needs: Not on file  Physical Activity: Not on file  Stress: Not on file  Social Connections: Not on file  Intimate Partner Violence: Not on file    Objective    Current Vital Signs 24h Vital Sign Ranges  T (!) 102.9 F (39.4 C) Temp  Avg: 102.9 F (39.4 C)  Min: 102.9 F (39.4 C)  Max: 102.9 F (39.4 C)  BP (!) 103/42 BP  Min: 103/42  Max: 103/42  HR (!) 143 Pulse  Avg: 143  Min: 143  Max: 143  RR 16 Resp  Avg: 16  Min: 16  Max: 16  SaO2 97 %   SpO2  Avg: 97 %  Min: 97 %  Max: 97 %       24 Hour I/O Current Shift I/O  Time Ins Outs No intake/output data recorded. No intake/output data recorded.   EFM: 180 baseline, rare slight decels, min variability, no accels Toco: q1-42m UCx vs irritability  General: Well nourished, well developed female in no acute distress.  Skin:  Warm and dry.  Cardiovascular: S1, S2  normal, no murmur, rub or gallop, regular rate and rhythm, tachy Respiratory:  Clear to auscultation bilateral. Normal respiratory effort Abdomen: gravid, nttp Back: left CVAT Neuro/Psych:  Normal mood and affect.    Labs  pending  Radiology none   Assessment & Plan  Pt stable Admit to antepartum Start rocephin 2gm qday, 1.5L IVF bolus, apap. Follow up 5/19 ucx Continuous EFM SCDs, regular diet, bedrest  Interpreter used  Tammy Cherry. MD Attending [facility] for [employer])

## 2021-06-14 ENCOUNTER — Encounter (HOSPITAL_COMMUNITY): Payer: Self-pay | Admitting: Obstetrics and Gynecology

## 2021-06-14 ENCOUNTER — Other Ambulatory Visit: Payer: Self-pay

## 2021-06-14 DIAGNOSIS — O2303 Infections of kidney in pregnancy, third trimester: Secondary | ICD-10-CM

## 2021-06-14 LAB — CBC WITH DIFFERENTIAL/PLATELET
Abs Immature Granulocytes: 0.53 10*3/uL — ABNORMAL HIGH (ref 0.00–0.07)
Basophils Absolute: 0.1 10*3/uL (ref 0.0–0.1)
Basophils Relative: 0 %
Eosinophils Absolute: 0 10*3/uL (ref 0.0–0.5)
Eosinophils Relative: 0 %
HCT: 25.3 % — ABNORMAL LOW (ref 36.0–46.0)
Hemoglobin: 8.2 g/dL — ABNORMAL LOW (ref 12.0–15.0)
Immature Granulocytes: 2 %
Lymphocytes Relative: 9 %
Lymphs Abs: 2.3 10*3/uL (ref 0.7–4.0)
MCH: 23.4 pg — ABNORMAL LOW (ref 26.0–34.0)
MCHC: 32.4 g/dL (ref 30.0–36.0)
MCV: 72.3 fL — ABNORMAL LOW (ref 80.0–100.0)
Monocytes Absolute: 1.9 10*3/uL — ABNORMAL HIGH (ref 0.1–1.0)
Monocytes Relative: 7 %
Neutro Abs: 21.9 10*3/uL — ABNORMAL HIGH (ref 1.7–7.7)
Neutrophils Relative %: 82 %
Platelets: 315 10*3/uL (ref 150–400)
RBC: 3.5 MIL/uL — ABNORMAL LOW (ref 3.87–5.11)
RDW: 15.8 % — ABNORMAL HIGH (ref 11.5–15.5)
WBC Morphology: INCREASED
WBC: 26.7 10*3/uL — ABNORMAL HIGH (ref 4.0–10.5)
nRBC: 0 % (ref 0.0–0.2)

## 2021-06-14 LAB — FOLATE: Folate: 16.9 ng/mL (ref >5.9)

## 2021-06-14 LAB — RETICULOCYTES
Immature Retic Fract: 27.2 % — ABNORMAL HIGH (ref 2.3–15.9)
RBC.: 3.5 MIL/uL — ABNORMAL LOW (ref 3.87–5.11)
Retic Count, Absolute: 46.9 10*3/uL (ref 19.0–186.0)
Retic Ct Pct: 1.3 % (ref 0.4–3.1)

## 2021-06-14 LAB — IRON AND TIBC
Iron: 11 ug/dL — ABNORMAL LOW (ref 28–170)
Saturation Ratios: 2 % — ABNORMAL LOW (ref 10.4–31.8)
TIBC: 557 ug/dL — ABNORMAL HIGH (ref 250–450)
UIBC: 546 ug/dL

## 2021-06-14 LAB — RPR: RPR Ser Ql: NONREACTIVE

## 2021-06-14 LAB — FERRITIN: Ferritin: 68 ng/mL (ref 11–307)

## 2021-06-14 LAB — VITAMIN B12: Vitamin B-12: 84 pg/mL — ABNORMAL LOW (ref 180–914)

## 2021-06-14 MED ORDER — POTASSIUM CHLORIDE CRYS ER 20 MEQ PO TBCR
20.0000 meq | EXTENDED_RELEASE_TABLET | Freq: Two times a day (BID) | ORAL | Status: DC
  Administered 2021-06-14 – 2021-06-15 (×3): 20 meq via ORAL
  Filled 2021-06-14 (×3): qty 1

## 2021-06-14 MED ORDER — LACTATED RINGERS IV SOLN
INTRAVENOUS | Status: AC
Start: 2021-06-14 — End: 2021-06-14

## 2021-06-14 MED ORDER — OXYCODONE HCL 5 MG PO TABS
5.0000 mg | ORAL_TABLET | Freq: Four times a day (QID) | ORAL | Status: DC | PRN
  Administered 2021-06-15: 5 mg via ORAL
  Filled 2021-06-14: qty 1

## 2021-06-14 NOTE — Progress Notes (Signed)
Daily Antepartum Note  Admission Date: 06/13/2021 Current Date: 06/14/2021 7:44 AM  Tammy Cherry is a 26 y.o. [id number] @ [redacted]w[redacted]d, HD#2, admitted for pyelo.  Pregnancy complicated by: Patient Active Problem List   Diagnosis Date Noted   Pyelonephritis affecting pregnancy 06/13/2021   Language barrier 06/13/2021   Glucose tolerance test abnormal 04/26/2021   Supervision of normal pregnancy 03/08/2021   Rubella non-immune status, antepartum 03/04/2021   Asymptomatic bacteriuria during pregnancy 03/04/2021    Overnight/24hr events:  none  Subjective:  Pt feeling better. No OB s/s  Objective:    Current Vital Signs 24h Vital Sign Ranges  T 97.9 F (36.6 C) Temp  Avg: 101 F (38.3 C)  Min: 97.9 F (36.6 C)  Max: 102.9 F (39.4 C)  BP 105/61 BP  Min: 97/48  Max: 105/61  HR (!) 105 Pulse  Avg: 129.3  Min: 105  Max: 143  RR 20 Resp  Avg: 21  Min: 16  Max: 30  SaO2 99 % Room Air SpO2  Avg: 97.8 %  Min: 97 %  Max: 99 %       24 Hour I/O Current Shift I/O  Time Ins Outs 05/21 0701 - 05/22 0700 In: 540.9 [I.V.:440.9] Out: -  No intake/output data recorded.   Patient Vitals for the past 24 hrs:  BP Temp Temp src Pulse Resp SpO2 Height Weight  06/14/21 0315 105/61 97.9 F (36.6 C) Oral (!) 105 20 99 % -- --  06/13/21 2311 (!) 103/47 100.2 F (37.9 C) Oral (!) 132 (!) 30 98 % -- --  06/13/21 2150 -- -- -- -- -- 97 % -- --  06/13/21 2145 -- -- -- -- -- 98 % -- --  06/13/21 2140 (!) 97/48 (!) 102.9 F (39.4 C) Oral (!) 137 18 98 % -- --  06/13/21 2114 (!) 103/42 (!) 102.9 F (39.4 C) Oral (!) 143 16 97 % 5' 2.99" (1.6 m) 68.8 kg   Fetal Heart Tones: 115 baseline, +accels, no decel, mod variability Tocometry: quiet  Physical exam: General: Well nourished, well developed female in no acute distress. Abdomen: gravid nttp Cardiovascular: S1, S2 normal, no murmur, rub or gallop, regular rate and rhythm Respiratory: CTAB Extremities: no clubbing, cyanosis or  edema Skin: Warm and dry.   Medications: Current Facility-Administered Medications  Medication Dose Route Frequency Provider Last Rate Last Admin   acetaminophen (TYLENOL) tablet 650 mg  650 mg Oral Q6H PRN Aletha Halim, MD       calcium carbonate (TUMS - dosed in mg elemental calcium) chewable tablet 400 mg of elemental calcium  2 tablet Oral Q4H PRN Aletha Halim, MD       cefTRIAXone (ROCEPHIN) 2 g in sodium chloride 0.9 % 100 mL IVPB  2 g Intravenous Q24H Gavin Pound, CNM 200 mL/hr at 06/14/21 0013 Infusion Verify at 06/14/21 0013   docusate sodium (COLACE) capsule 100 mg  100 mg Oral BID PRN Aletha Halim, MD       lactated ringers infusion   Intravenous Continuous Aletha Halim, MD 150 mL/hr at 06/14/21 0321 Rate Change at 06/14/21 0321   prenatal multivitamin tablet 1 tablet  1 tablet Oral Q1200 Aletha Halim, MD        Labs:  UCx pending (>100k GNR) COVID negative Recent Labs  Lab 06/13/21 2150 06/14/21 0439  WBC 25.9* 26.7*  HGB 9.3* 8.2*  HCT 28.8* 25.3*  PLT 321 315    Recent Labs  Lab 06/13/21 2150  NA  134*  K 3.4*  CL 105  CO2 18*  BUN 6  CREATININE 0.75  CALCIUM 8.5*  PROT 6.7  BILITOT 0.5  ALKPHOS 212*  ALT 11  AST 16  GLUCOSE 98    Radiology:  Renal u/s negative  Assessment & Plan:  Pt improving *Pregnancy: EFM reactive. can take off continuous EFM and do qday NSTs *Pyelo : continue rocephin. F/u 5/19 ucx *Preterm: no issues *Anemia: f/u panel *PPx: SCDs, OOB ad lit *FEN/GI: regular diet, MIVF x 18h *Dispo: maybe tomorrow or weds  Interpreter used  Durene Romans MD Attending Center for [employer] (Faculty Practice) GYN Consult Phone: [phone] (M-F, [phone]) & [phone]  (Off hours, weekends, holidays)

## 2021-06-15 LAB — CBC WITH DIFFERENTIAL/PLATELET
Abs Immature Granulocytes: 0.26 10*3/uL — ABNORMAL HIGH (ref 0.00–0.07)
Basophils Absolute: 0 10*3/uL (ref 0.0–0.1)
Basophils Relative: 0 %
Eosinophils Absolute: 0.1 10*3/uL (ref 0.0–0.5)
Eosinophils Relative: 1 %
HCT: 24.3 % — ABNORMAL LOW (ref 36.0–46.0)
Hemoglobin: 7.7 g/dL — ABNORMAL LOW (ref 12.0–15.0)
Immature Granulocytes: 2 %
Lymphocytes Relative: 16 %
Lymphs Abs: 2.3 10*3/uL (ref 0.7–4.0)
MCH: 23.1 pg — ABNORMAL LOW (ref 26.0–34.0)
MCHC: 31.7 g/dL (ref 30.0–36.0)
MCV: 72.8 fL — ABNORMAL LOW (ref 80.0–100.0)
Monocytes Absolute: 0.9 10*3/uL (ref 0.1–1.0)
Monocytes Relative: 6 %
Neutro Abs: 11 10*3/uL — ABNORMAL HIGH (ref 1.7–7.7)
Neutrophils Relative %: 75 %
Platelets: 313 10*3/uL (ref 150–400)
RBC: 3.34 MIL/uL — ABNORMAL LOW (ref 3.87–5.11)
RDW: 15.9 % — ABNORMAL HIGH (ref 11.5–15.5)
WBC: 14.6 10*3/uL — ABNORMAL HIGH (ref 4.0–10.5)
nRBC: 0 % (ref 0.0–0.2)

## 2021-06-15 LAB — CULTURE, OB URINE

## 2021-06-15 LAB — URINE CULTURE, OB REFLEX

## 2021-06-15 MED ORDER — CEFADROXIL 500 MG PO CAPS: 500.0000 mg | ORAL_CAPSULE | Freq: Two times a day (BID) | ORAL | 0 refills | Status: DC

## 2021-06-15 MED ORDER — OXYCODONE HCL 5 MG PO TABS: 5.0000 mg | ORAL_TABLET | Freq: Four times a day (QID) | ORAL | 0 refills | Status: DC | PRN

## 2021-06-15 NOTE — Discharge Summary (Signed)
Patient ID: Tammy Cherry MRN: [id number] DOB/AGE: Jul 03, 1995 25 y.o.  Admit date: 06/13/2021 Discharge date: 06/15/2021  Admission Diagnoses: IUP 35 1/7 weeks, pyelonephritis  Discharge Diagnoses: SAA, undelivered, IUP 35 3/7 weeks  Prenatal Procedures: NST  Consults: Neonatology, Maternal Fetal Medicine  Hospital Course:  This is a 26 y.o. [id number] with IUP at [redacted]w[redacted]d admitted for pyelonephritis. She had been receiving prenatal care at [facility]. She was started on Iv antibiotics. Renal U/S was normal. Responded well to antibiotics. CVA tenderness resolved and pt became afebrile. Urine culture was positive for Gram negative rods. Pt was transitioned to oral antibiotics. She progressed to ambulating, voiding, tolerating diet and good oral pain control. Fetal well being remained reassuring dur her hospitalization.    She was deemed stable for discharge to home with outpatient follow up. Discharge instructions, medications and follow up were reviewed with pt. A live interrupter was used.   Discharge Exam: Temp:  [97.5 F (36.4 C)-98.5 F (36.9 C)] 97.8 F (36.6 C) (05/23 1144) Pulse Rate:  [102-117] 102 (05/23 1144) Resp:  [16-18] 17 (05/23 1144) BP: (100-119)/(49-71) 116/71 (05/23 1144) SpO2:  [97 %-99 %] 97 % (05/23 1144) Physical Examination: Lungs clear Heart RRR Abd soft + BS gravid No CVA tenderness Ext non tender  Fetal monitoring: FHR: 140's bpm, Variability: moderate, Accelerations: Present, Decelerations: Absent  Uterine activity: no contractions per hour  Significant Diagnostic Studies:  Results for orders placed or performed during the hospital encounter of 06/13/21 (from the past 168 hour(s))  Urinalysis, Routine w reflex microscopic   Collection Time: 06/13/21  9:03 PM  Result Value Ref Range   Color, Urine YELLOW YELLOW   APPearance CLOUDY (A) CLEAR   Specific Gravity, Urine 1.010 1.005 - 1.030   pH 6.0 5.0 - 8.0   Glucose,  UA NEGATIVE NEGATIVE mg/dL   Hgb urine dipstick MODERATE (A) NEGATIVE   Bilirubin Urine NEGATIVE NEGATIVE   Ketones, ur NEGATIVE NEGATIVE mg/dL   Protein, ur 30 (A) NEGATIVE mg/dL   Nitrite POSITIVE (A) NEGATIVE   Leukocytes,Ua LARGE (A) NEGATIVE   RBC / HPF 0-5 0 - 5 RBC/hpf   WBC, UA >50 (H) 0 - 5 WBC/hpf   Bacteria, UA FEW (A) NONE SEEN   Squamous Epithelial / LPF 11-20 0 - 5  CBC with Differential/Platelet   Collection Time: 06/13/21  9:50 PM  Result Value Ref Range   WBC 25.9 (H) 4.0 - 10.5 K/uL   RBC 4.00 3.87 - 5.11 MIL/uL   Hemoglobin 9.3 (L) 12.0 - 15.0 g/dL   HCT 14.7 (L) 82.9 - 56.2 %   MCV 72.0 (L) 80.0 - 100.0 fL   MCH 23.3 (L) 26.0 - 34.0 pg   MCHC 32.3 30.0 - 36.0 g/dL   RDW 13.0 (H) 86.5 - 78.4 %   Platelets 321 150 - 400 K/uL   nRBC 0.0 0.0 - 0.2 %   Neutrophils Relative % 87 %   Neutro Abs 22.2 (H) 1.7 - 7.7 K/uL   Lymphocytes Relative 5 %   Lymphs Abs 1.4 0.7 - 4.0 K/uL   Monocytes Relative 7 %   Monocytes Absolute 1.9 (H) 0.1 - 1.0 K/uL   Eosinophils Relative 0 %   Eosinophils Absolute 0.0 0.0 - 0.5 K/uL   Basophils Relative 0 %   Basophils Absolute 0.1 0.0 - 0.1 K/uL   Immature Granulocytes 1 %   Abs Immature Granulocytes 0.37 (H) 0.00 - 0.07 K/uL  Comprehensive metabolic panel  Collection Time: 06/13/21  9:50 PM  Result Value Ref Range   Sodium 134 (L) 135 - 145 mmol/L   Potassium 3.4 (L) 3.5 - 5.1 mmol/L   Chloride 105 98 - 111 mmol/L   CO2 18 (L) 22 - 32 mmol/L   Glucose, Bld 98 70 - 99 mg/dL   BUN 6 6 - 20 mg/dL   Creatinine, Ser 8.290.75 0.44 - 1.00 mg/dL   Calcium 8.5 (L) 8.9 - 10.3 mg/dL   Total Protein 6.7 6.5 - 8.1 g/dL   Albumin 2.7 (L) 3.5 - 5.0 g/dL   AST 16 15 - 41 U/L   ALT 11 0 - 44 U/L   Alkaline Phosphatase 212 (H) 38 - 126 U/L   Total Bilirubin 0.5 0.3 - 1.2 mg/dL   GFR, Estimated >56>60 >21>60 mL/min   Anion gap 11 5 - 15  RPR   Collection Time: 06/13/21  9:50 PM  Result Value Ref Range   RPR Ser Ql NON REACTIVE NON REACTIVE   Vitamin B12   Collection Time: 06/13/21  9:50 PM  Result Value Ref Range   Vitamin B-12 84 (L) 180 - 914 pg/mL  Iron and TIBC   Collection Time: 06/13/21  9:50 PM  Result Value Ref Range   Iron 11 (L) 28 - 170 ug/dL   TIBC 308557 (H) 657250 - 846450 ug/dL   Saturation Ratios 2 (L) 10.4 - 31.8 %   UIBC 546 ug/dL  Ferritin   Collection Time: 06/13/21  9:50 PM  Result Value Ref Range   Ferritin 68 11 - 307 ng/mL  Folate   Collection Time: 06/13/21  9:50 PM  Result Value Ref Range   Folate 16.9 >5.9 ng/mL  Type and screen   Collection Time: 06/13/21  9:50 PM  Result Value Ref Range   ABO/RH(D) O POS    Antibody Screen NEG    Sample Expiration      06/16/2021,2359 Performed at [facility], [address] [place], [place] [id number]   Resp Panel by RT-PCR (Flu A&B, Covid) Nasopharyngeal Swab   Collection Time: 06/13/21 10:07 PM   Specimen: Nasopharyngeal Swab; Nasopharyngeal(NP) swabs in vial transport medium  Result Value Ref Range   SARS Coronavirus 2 by RT PCR NEGATIVE NEGATIVE   Influenza A by PCR NEGATIVE NEGATIVE   Influenza B by PCR NEGATIVE NEGATIVE  CBC with Differential/Platelet   Collection Time: 06/14/21  4:39 AM  Result Value Ref Range   WBC 26.7 (H) 4.0 - 10.5 K/uL   RBC 3.50 (L) 3.87 - 5.11 MIL/uL   Hemoglobin 8.2 (L) 12.0 - 15.0 g/dL   HCT 28.425.3 (L) 13.236.0 - 44.046.0 %   MCV 72.3 (L) 80.0 - 100.0 fL   MCH 23.4 (L) 26.0 - 34.0 pg   MCHC 32.4 30.0 - 36.0 g/dL   RDW 10.215.8 (H) 72.511.5 - 36.615.5 %   Platelets 315 150 - 400 K/uL   nRBC 0.0 0.0 - 0.2 %   Neutrophils Relative % 82 %   Neutro Abs 21.9 (H) 1.7 - 7.7 K/uL   Lymphocytes Relative 9 %   Lymphs Abs 2.3 0.7 - 4.0 K/uL   Monocytes Relative 7 %   Monocytes Absolute 1.9 (H) 0.1 - 1.0 K/uL   Eosinophils Relative 0 %   Eosinophils Absolute 0.0 0.0 - 0.5 K/uL   Basophils Relative 0 %   Basophils Absolute 0.1 0.0 - 0.1 K/uL   WBC Morphology INCREASED BANDS (>20% BANDS)    RBC  Morphology MORPHOLOGY UNREMARKABLE     Smear Review Reviewed    Immature Granulocytes 2 %   Abs Immature Granulocytes 0.53 (H) 0.00 - 0.07 K/uL  Reticulocytes   Collection Time: 06/14/21  4:39 AM  Result Value Ref Range   Retic Ct Pct 1.3 0.4 - 3.1 %   RBC. 3.50 (L) 3.87 - 5.11 MIL/uL   Retic Count, Absolute 46.9 19.0 - 186.0 K/uL   Immature Retic Fract 27.2 (H) 2.3 - 15.9 %  CBC with Differential/Platelet   Collection Time: 06/15/21 10:02 AM  Result Value Ref Range   WBC 14.6 (H) 4.0 - 10.5 K/uL   RBC 3.34 (L) 3.87 - 5.11 MIL/uL   Hemoglobin 7.7 (L) 12.0 - 15.0 g/dL   HCT 30.0 (L) 92.3 - 30.0 %   MCV 72.8 (L) 80.0 - 100.0 fL   MCH 23.1 (L) 26.0 - 34.0 pg   MCHC 31.7 30.0 - 36.0 g/dL   RDW 76.2 (H) 26.3 - 33.5 %   Platelets 313 150 - 400 K/uL   nRBC 0.0 0.0 - 0.2 %   Neutrophils Relative % 75 %   Neutro Abs 11.0 (H) 1.7 - 7.7 K/uL   Lymphocytes Relative 16 %   Lymphs Abs 2.3 0.7 - 4.0 K/uL   Monocytes Relative 6 %   Monocytes Absolute 0.9 0.1 - 1.0 K/uL   Eosinophils Relative 1 %   Eosinophils Absolute 0.1 0.0 - 0.5 K/uL   Basophils Relative 0 %   Basophils Absolute 0.0 0.0 - 0.1 K/uL   Immature Granulocytes 2 %   Abs Immature Granulocytes 0.26 (H) 0.00 - 0.07 K/uL  Results for orders placed or performed in visit on 06/11/21 (from the past 168 hour(s))  Culture, OB Urine   Collection Time: 06/11/21  3:00 PM   Specimen: Urine   UR  Result Value Ref Range   Urine Culture, OB Preliminary report (A)   Urine Culture, OB Reflex   Collection Time: 06/11/21  3:00 PM  Result Value Ref Range   Organism ID, Bacteria Gram negative rods (A)     Discharge Condition: Stable  Disposition: Discharge disposition: 01-Home or Self Care        Discharge Instructions     Discharge activity:  No Restrictions   Complete by: As directed    Discharge diet:  No restrictions   Complete by: As directed    Fetal Kick Count:  Lie on our left side for one hour after a meal, and count the number of times your baby kicks.   If it is less than 5 times, get up, move around and drink some juice.  Repeat the test 30 minutes later.  If it is still less than 5 kicks in an hour, notify your doctor.   Complete by: As directed    No sexual activity restrictions   Complete by: As directed    Notify physician for a general feeling that "something is not right"   Complete by: As directed    Notify physician for increase or change in vaginal discharge   Complete by: As directed    Notify physician for intestinal cramps, with or without diarrhea, sometimes described as "gas pain"   Complete by: As directed    Notify physician for leaking of fluid   Complete by: As directed    Notify physician for low, dull backache, unrelieved by heat or Tylenol   Complete by: As directed    Notify physician for menstrual like cramps  Complete by: As directed    Notify physician for pelvic pressure   Complete by: As directed    Notify physician for uterine contractions.  These may be painless and feel like the uterus is tightening or the baby is  "balling up"   Complete by: As directed    Notify physician for vaginal bleeding   Complete by: As directed    PRETERM LABOR:  Includes any of the follwing symptoms that occur between 20 - [redacted] weeks gestation.  If these symptoms are not stopped, preterm labor can result in preterm delivery, placing your baby at risk   Complete by: As directed       Allergies as of 06/15/2021   No Known Allergies      Medication List     STOP taking these medications    cephALEXin 250 MG capsule Commonly known as: Keflex   cyclobenzaprine 10 MG tablet Commonly known as: FLEXERIL       TAKE these medications    aspirin EC 81 MG tablet Take 1 tablet (81 mg total) by mouth daily. Swallow whole.   cefadroxil 500 MG capsule Commonly known as: DURICEF Take 1 capsule (500 mg total) by mouth 2 (two) times daily.   famotidine 20 MG tablet Commonly known as: PEPCID Take 1 tablet (20 mg total) by  mouth 2 (two) times daily.   ferrous sulfate 325 (65 FE) MG tablet Take 1 tablet (325 mg total) by mouth every other day.   multivitamin-prenatal 27-0.8 MG Tabs tablet Take 1 tablet by mouth daily at 12 noon.   oxyCODONE 5 MG immediate release tablet Commonly known as: Oxy IR/ROXICODONE Take 1 tablet (5 mg total) by mouth every 6 (six) hours as needed for severe pain.        Follow-up Information     [facility] Follow up.   Why: Pt already has appt June 1 Contact information: [address] [place] [address] [phone]                Signed: Hermina Staggers M.D. 06/15/2021, 2:49 PM

## 2021-06-15 NOTE — Progress Notes (Signed)
Discharge instructions and prescriptions given to pt with video interpreter at bedside. Discussed signs and symptoms to report to the MD, upcoming appointments, and meds. Pt verbalizes understanding and has no questions or concerns at this time. Pt discharged home from hospital in stable condition.

## 2021-06-24 ENCOUNTER — Inpatient Hospital Stay (HOSPITAL_COMMUNITY)
Admission: AD | Admit: 2021-06-24 | Discharge: 2021-06-24 | Disposition: A | Discharge status: Home / Self Care | Payer: Self-pay | Attending: Obstetrics & Gynecology | Admitting: Obstetrics & Gynecology

## 2021-06-24 ENCOUNTER — Ambulatory Visit (INDEPENDENT_AMBULATORY_CARE_PROVIDER_SITE_OTHER): Payer: Self-pay | Admitting: Family Medicine

## 2021-06-24 ENCOUNTER — Encounter (HOSPITAL_COMMUNITY): Payer: Self-pay | Admitting: Obstetrics & Gynecology

## 2021-06-24 ENCOUNTER — Other Ambulatory Visit: Payer: Self-pay

## 2021-06-24 VITALS — BP 122/67 | HR 83 | Wt 147.8 lb

## 2021-06-24 DIAGNOSIS — R9431 Abnormal electrocardiogram [ECG] [EKG]: Secondary | ICD-10-CM | POA: Insufficient documentation

## 2021-06-24 DIAGNOSIS — O2303 Infections of kidney in pregnancy, third trimester: Secondary | ICD-10-CM

## 2021-06-24 DIAGNOSIS — R079 Chest pain, unspecified: Secondary | ICD-10-CM

## 2021-06-24 DIAGNOSIS — R55 Syncope and collapse: Secondary | ICD-10-CM

## 2021-06-24 DIAGNOSIS — R0789 Other chest pain: Secondary | ICD-10-CM

## 2021-06-24 DIAGNOSIS — Z3A36 36 weeks gestation of pregnancy: Secondary | ICD-10-CM | POA: Insufficient documentation

## 2021-06-24 DIAGNOSIS — Z3483 Encounter for supervision of other normal pregnancy, third trimester: Secondary | ICD-10-CM

## 2021-06-24 DIAGNOSIS — R42 Dizziness and giddiness: Secondary | ICD-10-CM | POA: Insufficient documentation

## 2021-06-24 DIAGNOSIS — D509 Iron deficiency anemia, unspecified: Secondary | ICD-10-CM

## 2021-06-24 DIAGNOSIS — O26893 Other specified pregnancy related conditions, third trimester: Secondary | ICD-10-CM | POA: Insufficient documentation

## 2021-06-24 HISTORY — DX: Anemia, unspecified: D64.9

## 2021-06-24 LAB — COMPREHENSIVE METABOLIC PANEL
ALT: 16 U/L (ref 0–44)
AST: 19 U/L (ref 15–41)
Albumin: 2.9 g/dL — ABNORMAL LOW (ref 3.5–5.0)
Alkaline Phosphatase: 216 U/L — ABNORMAL HIGH (ref 38–126)
Anion gap: 8 (ref 5–15)
BUN: 6 mg/dL (ref 6–20)
CO2: 21 mmol/L — ABNORMAL LOW (ref 22–32)
Calcium: 8.7 mg/dL — ABNORMAL LOW (ref 8.9–10.3)
Chloride: 107 mmol/L (ref 98–111)
Creatinine, Ser: 0.56 mg/dL (ref 0.44–1.00)
GFR, Estimated: >60 mL/min (ref >60)
Glucose, Bld: 75 mg/dL (ref 70–99)
Potassium: 3.9 mmol/L (ref 3.5–5.1)
Sodium: 136 mmol/L (ref 135–145)
Total Bilirubin: 0.5 mg/dL (ref 0.3–1.2)
Total Protein: 6.7 g/dL (ref 6.5–8.1)

## 2021-06-24 LAB — CBC
HCT: 33.1 % — ABNORMAL LOW (ref 36.0–46.0)
Hemoglobin: 10.4 g/dL — ABNORMAL LOW (ref 12.0–15.0)
MCH: 23 pg — ABNORMAL LOW (ref 26.0–34.0)
MCHC: 31.4 g/dL (ref 30.0–36.0)
MCV: 73.2 fL — ABNORMAL LOW (ref 80.0–100.0)
Platelets: 456 10*3/uL — ABNORMAL HIGH (ref 150–400)
RBC: 4.52 MIL/uL (ref 3.87–5.11)
RDW: 15.9 % — ABNORMAL HIGH (ref 11.5–15.5)
WBC: 15 10*3/uL — ABNORMAL HIGH (ref 4.0–10.5)
nRBC: 0 % (ref 0.0–0.2)

## 2021-06-24 LAB — TROPONIN I (HIGH SENSITIVITY): Troponin I (High Sensitivity): 8 ng/L (ref <18)

## 2021-06-24 LAB — POCT HEMOGLOBIN: Hemoglobin: 10 g/dL — AB (ref 11–14.6)

## 2021-06-24 MED ORDER — CEFACLOR 500 MG PO CAPS: 500.0000 mg | ORAL_CAPSULE | Freq: Every day | ORAL | 1 refills | Status: DC

## 2021-06-24 MED ORDER — CEFACLOR 250 MG PO CAPS: 250.0000 mg | ORAL_CAPSULE | Freq: Every day | ORAL | 1 refills | Status: DC

## 2021-06-24 MED ORDER — LACTATED RINGERS IV BOLUS
1000.0000 mL | Freq: Once | INTRAVENOUS | Status: AC
  Administered 2021-06-24: 1000 mL via INTRAVENOUS

## 2021-06-24 NOTE — MAU Provider Note (Signed)
History     [id number]  Arrival date and time: 06/24/21 1029    Chief Complaint  Patient presents with   Dizziness     HPI Tammy Cherry is a 26 y.o. at [redacted]w[redacted]d with PMHx notable for SGA in prior pregnancy, who presents for concern for chest pain.   Signout given by Dr. Miquel Dunn, patient seen in clinic this morning for prenatal visit Recently admitted and discharged for pyelonephritis, hgb at discharge was in 7's Today was reporting she was not feeling well, having intermittent episodes of chest pain and dizziness Had another episode while in clinic and was not well appearing, sent to MAU for evaluation Had ECG obtained prior which I can not yet see in the computer  On my history patient reports she feels fine now Does endorse having episode during which she felt a cold sweat, nausea, and some dizziness just prior to getting an ECG Has happened to her two other times since discharge from the hospital and lasted about ten minutes Also has some chest pain associated with these episodes Reports she did not have breakfast this morning and thinks this may have been related to her symptoms Denies vaginal bleeding or loss of fluid Has normal fetal movement No contractions   --/--/O POS (05/21 2150)  OB History     Gravida  3   Para  2   Term  2   Preterm      AB      Living  2      SAB      IAB      Ectopic      Multiple      Live Births  2           Past Medical History:  Diagnosis Date   Anemia    Gall stones     Past Surgical History:  Procedure Laterality Date   NO PAST SURGERIES      History reviewed. No pertinent family history.  Social History   Socioeconomic History   Marital status: Significant Other    Spouse name: Not on file   Number of children: Not on file   Years of education: Not on file   Highest education level: Not on file  Occupational History   Not on file  Tobacco Use   Smoking status: Never    Smokeless tobacco: Never  Vaping Use   Vaping Use: Never used  Substance and Sexual Activity   Alcohol use: Never   Drug use: Never   Sexual activity: Yes    Birth control/protection: None  Other Topics Concern   Not on file  Social History Narrative   Not on file   Social Determinants of Health   Financial Resource Strain: Not on file  Food Insecurity: Not on file  Transportation Needs: Not on file  Physical Activity: Not on file  Stress: Not on file  Social Connections: Not on file  Intimate Partner Violence: Not on file    No Known Allergies  No current facility-administered medications on file prior to encounter.   Current Outpatient Medications on File Prior to Encounter  Medication Sig Dispense Refill   ferrous sulfate 325 (65 FE) MG tablet Take 1 tablet (325 mg total) by mouth every other day. 30 tablet 3   Prenatal Vit-Fe Fumarate-FA (MULTIVITAMIN-PRENATAL) 27-0.8 MG TABS tablet Take 1 tablet by mouth daily at 12 noon.     aspirin EC 81 MG tablet Take 1 tablet (81 mg  total) by mouth daily. Swallow whole. 30 tablet 11   cefaclor (CECLOR) 250 MG capsule Take 1 capsule (250 mg total) by mouth daily. 30 capsule 1   cefadroxil (DURICEF) 500 MG capsule Take 1 capsule (500 mg total) by mouth 2 (two) times daily. (Patient not taking: Reported on 06/24/2021) 14 capsule 0   famotidine (PEPCID) 20 MG tablet Take 1 tablet (20 mg total) by mouth 2 (two) times daily. 30 tablet 1   oxyCODONE (OXY IR/ROXICODONE) 5 MG immediate release tablet Take 1 tablet (5 mg total) by mouth every 6 (six) hours as needed for severe pain. (Patient not taking: Reported on 06/24/2021) 12 tablet 0     ROS Pertinent positives and negative per HPI, all others reviewed and negative  Physical Exam   BP 107/65 (BP Location: Right Arm)   Pulse 92   Temp 98.2 F (36.8 C) (Oral)   Resp 20   Ht 5' 4.96" (1.65 m)   Wt 67.2 kg   LMP 10/10/2020 (Exact Date)   SpO2 98%   BMI 24.69 kg/m   Patient Vitals  for the past 24 hrs:  BP Temp Temp src Pulse Resp SpO2 Height Weight  06/24/21 1053 107/65 98.2 F (36.8 C) Oral 92 20 98 % -- --  06/24/21 1042 -- -- -- -- -- -- 5' 4.96" (1.65 m) 67.2 kg    Physical Exam Vitals reviewed.  Constitutional:      General: She is not in acute distress.    Appearance: She is well-developed. She is not diaphoretic.  Eyes:     General: No scleral icterus. Cardiovascular:     Rate and Rhythm: Normal rate and regular rhythm.     Heart sounds: Normal heart sounds. No murmur heard.   No friction rub.  Pulmonary:     Effort: Pulmonary effort is normal. No respiratory distress.     Breath sounds: No wheezing or rales.  Abdominal:     General: There is no distension.     Palpations: Abdomen is soft.     Tenderness: There is no abdominal tenderness. There is no guarding or rebound.  Musculoskeletal:     Right lower leg: No edema.     Left lower leg: No edema.  Skin:    General: Skin is warm and dry.  Neurological:     Mental Status: She is alert.     Coordination: Coordination normal.     Cervical Exam    Bedside Ultrasound Not done  My interpretation: n/a  FHT Baseline 130, moderate variability, +accels, no decels Toco: irritability Cat: I  Labs Results for orders placed or performed during the hospital encounter of 06/24/21 (from the past 24 hour(s))  CBC     Status: Abnormal   Collection Time: 06/24/21 11:56 AM  Result Value Ref Range   WBC 15.0 (H) 4.0 - 10.5 K/uL   RBC 4.52 3.87 - 5.11 MIL/uL   Hemoglobin 10.4 (L) 12.0 - 15.0 g/dL   HCT 11.933.1 (L) 14.736.0 - 82.946.0 %   MCV 73.2 (L) 80.0 - 100.0 fL   MCH 23.0 (L) 26.0 - 34.0 pg   MCHC 31.4 30.0 - 36.0 g/dL   RDW 56.215.9 (H) 13.011.5 - 86.515.5 %   Platelets 456 (H) 150 - 400 K/uL   nRBC 0.0 0.0 - 0.2 %  Comprehensive metabolic panel     Status: Abnormal   Collection Time: 06/24/21 11:56 AM  Result Value Ref Range   Sodium 136 135 - 145 mmol/L  Potassium 3.9 3.5 - 5.1 mmol/L   Chloride 107 98 -  111 mmol/L   CO2 21 (L) 22 - 32 mmol/L   Glucose, Bld 75 70 - 99 mg/dL   BUN 6 6 - 20 mg/dL   Creatinine, Ser 9.20 0.44 - 1.00 mg/dL   Calcium 8.7 (L) 8.9 - 10.3 mg/dL   Total Protein 6.7 6.5 - 8.1 g/dL   Albumin 2.9 (L) 3.5 - 5.0 g/dL   AST 19 15 - 41 U/L   ALT 16 0 - 44 U/L   Alkaline Phosphatase 216 (H) 38 - 126 U/L   Total Bilirubin 0.5 0.3 - 1.2 mg/dL   GFR, Estimated >10 >07 mL/min   Anion gap 8 5 - 15  Troponin I (High Sensitivity)     Status: None   Collection Time: 06/24/21 11:56 AM  Result Value Ref Range   Troponin I (High Sensitivity) 8 <18 ng/L    Imaging No results found.  MAU Course  Procedures Lab Orders         CBC         Comprehensive metabolic panel     Meds ordered this encounter  Medications   lactated ringers bolus 1,000 mL   Imaging Orders  No imaging studies ordered today    MDM moderate  Assessment and Plan  #Chest pain #Pre-syncope #Abnormal ECG #[redacted] weeks gestation of pregnancy Unremarkable workup. ECG shows NSR with TWI in leads III and aVF but otherwise unremarkable and no other ECG available for comparison. Troponin negative, CBC and CMP unremarkable. Orthostatics were negative but overall still most c/w vagal episodes. Low suspicion for cardiac etiology given neg troponin. Recommended good hydration.   #FWB FHT Cat I NST: Reactive   Dispo: discharged to home in stable condition.    Venora Maples, MD/MPH 06/24/21 1:35 PM  Allergies as of 06/24/2021   No Known Allergies      Medication List     TAKE these medications    aspirin EC 81 MG tablet Take 1 tablet (81 mg total) by mouth daily. Swallow whole.   cefaclor 250 MG capsule Commonly known as: CECLOR Take 1 capsule (250 mg total) by mouth daily.   cefadroxil 500 MG capsule Commonly known as: DURICEF Take 1 capsule (500 mg total) by mouth 2 (two) times daily.   famotidine 20 MG tablet Commonly known as: PEPCID Take 1 tablet (20 mg total) by mouth 2  (two) times daily.   ferrous sulfate 325 (65 FE) MG tablet Take 1 tablet (325 mg total) by mouth every other day.   multivitamin-prenatal 27-0.8 MG Tabs tablet Take 1 tablet by mouth daily at 12 noon.   oxyCODONE 5 MG immediate release tablet Commonly known as: Oxy IR/ROXICODONE Take 1 tablet (5 mg total) by mouth every 6 (six) hours as needed for severe pain.

## 2021-06-24 NOTE — Patient Instructions (Signed)
Precauciones de devolución relacionadas con el embarazo ° °Los siguientes son signos/síntomas que son anormales en el embarazo y pueden requerir una evaluación adicional por parte de un médico: °Vaya a [facility] en [facility] [place] si: °Tiene calambres/contracciones que no desaparecen con agua potable, especialmente si duran de 30 segundos a 1,5 minutos, aparecen y desaparecen cada 5-10 minutos durante una hora o más, o si son cada vez más fuertes y no puede caminar o hablar mientras tener una contracción/calambre. °Tu agua se rompe. A veces es un gran chorro de líquido, a veces es solo un goteo que sigue mojando tu ropa interior o corriendo por tus piernas. °Tiene sangrado vaginal. °No siente que su bebé se mueva normalmente. Si no lo hace, busque algo para comer y beber (algo frío o algo con azúcar como mantequilla de maní o jugo) y acuéstese y concéntrese en sentir cómo se mueve su bebé. Si su bebé todavía no se mueve con normalidad, debe ir a MAU. Debe sentir que su bebé se mueve 6 veces en una hora o 10 veces en dos horas. °Tiene un dolor de cabeza persistente que no desaparece con 1 g de Tylenol, cambios en la visión, dolor en el pecho, dificultad para respirar, dolor intenso en la parte superior derecha del abdomen, empeoramiento de la hinchazón de las piernas; todos estos pueden ser signos de presión arterial alta en el embarazo y necesita para ser evaluado por un proveedor inmediatamente ° °Todos estos son preocupantes durante el embarazo y, si tiene alguno de estos, le recomiendo que llame a su PCP y se presente a la [employer] de Maternidad (mapa a continuación) para una evaluación adicional. ° °Para cualquier emergencia relacionada con el embarazo, diríjase a la [facility] en el [facility]. Usará la Entrada C del hospital. ° °  °

## 2021-06-24 NOTE — MAU Note (Signed)
Tammy Cherry [name] is a 26 y.o. at [redacted]w[redacted]d here in MAU reporting: sent from office for evaluation of dizziness during EKG and nausea.  Reports hasn't vomited although felt like needed to.  Reports had early MD appt & hadn't eaten breakfast today. Denies  current dizziness, chest pain and SOB, states has intermittent spells of chest pain & SOB. Endorses +FM.  Denies VB or LOF.  Onset of complaint: today Pain score: 0 Vitals:   06/24/21 1053  BP: 107/65  Pulse: 92  Resp: 20  Temp: 98.2 F (36.8 C)  SpO2: 98%     FHT:149 bpm Lab orders placed from triage:

## 2021-06-24 NOTE — Progress Notes (Signed)
  [facility] Prenatal Visit  Tammy Cherry Tammy Cherry is a 26 y.o. [phone] at [redacted]w[redacted]d here for routine follow up. She is dated by LMP. She reports fetal movement. She denies vaginal bleeding, contractions, or loss of fluid. See flow sheet for details.  When hospitalized recently, did not eat much but otherwise has been eating well.   Low Hgb documented in third trimester, taking oral iron. Dizziness, SOB, and chest pain present.   In office, patient had an episode of blurry vision, nausea, and dizziness but no syncopal event.   Chest Pain: Patient endorses pain once a day for "awhile", timeframe difficult to assess. prior to admission to hospital. Location on left side; does not radiate to jaw or left arm.   Patient has recently hospitalized for pyelonephritis, finished Duricef a few days ago. No evidence of dysuria, back pain, fever.   Pregnancy history:  SVD, female infant- SGA (1100g) SVD, female infant - SGA  Medications tab updated    Vitals:   06/24/21 0906  BP: 122/67  Pulse: 83    A/P: Pregnancy at [redacted]w[redacted]d.  Doing well.   Routine prenatal care  Dating reviewed, dating tab is correct Fetal heart tones Appropriate; 140s  Fundal height -- outside of range (see below) Fetal position confirmed Vertex using Ultrasound .  The patient does not have a history of HSV and valacyclovir is not indicated at this time.  Infant feeding choice: Both  Contraception choice: Nexplanon  Infant circumcision desired: no Pediatrician: Not yet decided (Needs list at next follow up) Tdap: Obtained at recent [facility] visit.  COVID vaccination was discussed, and patient has received these, will update vaccination status when able.  Pregnancy education regarding preterm labor, fetal movement,  benefits of breastfeeding, contraception, fetal growth, expected weight gain, and safe infant sleep were discussed.   2. Pregnancy issues include the following and were addressed as appropriate  today:  Recent Pyelonephritis: Hospitalized from 06/13/21 to 06/15/21 with IV abx at [facility] and discharged with [facility].   Chest Pain  Dyspnea: Patient presents with several days of intermittent CP and dyspnea. POCT Hgb 10, making severe symptomatic anemia as cause less likely (although unsure of reliability of POCT hgb as previous Hgb was 7.7). EKG performed in office a shows NSR.  Given findings and risk associated with pregnancy, concerned for possibility of PE (Well's score of 1.5). Need for stat labs and work up for potential PE requiring MAU assessment as we are unable to do so in clinic. Also, history of pyelonephritis causes concern for sepsis in setting of infection. However, vitals show no hemodynamic instability at this time.   Fundal Height discrepancy  Maternal Weight Loss: Fundal height on exam today measuring 32, discordant with GA. History of SGA deliveries in past. However, last EFW 78%ile on 04/29/21. Weight loss of patient of 2 lbs since last visit.   Unable to obtain GC/chlamydia or GBS swab at this visit given acute nature of visit.   Patient monitored and taken via wheelchair by clinic staff to MAU.   Follow up depending on evaluation at MAU. Will closely monitor course.   In person and Ipad interpreter utilized throughout visit.

## 2021-06-27 LAB — URINE CULTURE, OB REFLEX

## 2021-06-27 LAB — CULTURE, OB URINE

## 2021-07-02 ENCOUNTER — Ambulatory Visit (INDEPENDENT_AMBULATORY_CARE_PROVIDER_SITE_OTHER): Payer: Self-pay | Admitting: Family Medicine

## 2021-07-02 ENCOUNTER — Other Ambulatory Visit (HOSPITAL_COMMUNITY)
Admission: RE | Admit: 2021-07-02 | Discharge: 2021-07-02 | Disposition: A | Discharge status: Home / Self Care | Payer: Self-pay | Source: Ambulatory Visit | Attending: Family Medicine | Admitting: Family Medicine

## 2021-07-02 VITALS — BP 104/62 | HR 104 | Wt 147.0 lb

## 2021-07-02 DIAGNOSIS — Z3480 Encounter for supervision of other normal pregnancy, unspecified trimester: Secondary | ICD-10-CM

## 2021-07-02 NOTE — Patient Instructions (Signed)
It was great seeing you today!  Glad to see you are doing well!   Go to the MAU at [facility] at [facility] if: You begin to have strong, frequent contractions Your water breaks.  Sometimes it is a big gush of fluid, sometimes it is just a trickle that keeps getting your underwear wet or running down your legs You have vaginal bleeding.  It is normal to have a small amount of spotting if your cervix was checked.  You do not feel your baby moving like normal.  If you do not, get something to eat and drink and lay down and focus on feeling your baby move.   If your baby is still not moving like normal, you should go to MAU.   We will see you back in 1 week for your next follow up  Feel free to call with any questions or concerns at any time, at [phone].   Take care,  Dr. Cora Collum [facility]  Fue genial verte hoy!  Me alegra ver que lo ests haciendo bien!  Vaya a MAU en [facility] [place] si: ? Comienza a tener contracciones fuertes y frecuentes. ? Tu agua se rompe. A veces es un gran chorro de lquido, a veces es solo un goteo que sigue mojando tu ropa interior o corriendo por tus piernas. ? Tiene sangrado vaginal. Es normal tener una pequea cantidad de manchas si se revis el cuello uterino. ? No siente que su beb se mueva normalmente. Si no lo hace, busque algo de comer y beber, acustese y concntrese en sentir cmo se mueve su beb. Si su beb todava no se mueve con normalidad, debe ir a MAU.  Nos vemos en 1 semana para su prximo seguimiento  No dude en llamar con cualquier pregunta o inquietud en cualquier momento, al [phone].   Cuidarse, Dra. Orbie [facility] [place]

## 2021-07-02 NOTE — Progress Notes (Signed)
  [place] Prenatal Visit  Tammy Cherry Tammy Cherry is a 26 y.o. [phone] at [redacted]w[redacted]d here for routine follow up. She is dated by LMP.  She endorses having a sticky discharge a week ago but no gushes of fluid. Also endorses some back tightness. She reports fetal movement. She denies vaginal bleeding, contractions, or loss of fluid. See flow sheet for details.  SVD, female infant- SGA (1100g) SVD, female infant - SGA   Vitals:   07/02/21 1450  BP: 104/62  Pulse: (!) 104    A/P: Pregnancy at [redacted]w[redacted]d.  Doing well.   Routine prenatal care:  Dating reviewed, dating tab is correct Fetal heart tones Appropriate 140 bpm Fundal height 35cm  Fetal position confirmed Vertex using Ultrasound .  Infant feeding choice: Formula feeding  Contraception choice: Nexplanon  Infant circumcision desired n/a Pain control in labor discussed and patient desires none.  Influenza vaccine previously administered.   Tdap previously administered between 27-36 weeks  GBS and gc/chlamydia obtained today.   Pregnancy education regarding labor, fetal movement,  benefits of breastfeeding, contraception, and safe infant sleep were discussed.  Labor and fetal movement precautions reviewed.  2. Pregnancy issues include the following and were addressed as appropriate today:  Problem list and pregnancy box updated: Yes.   Cervix checked today with dilation of 1.5 cm  Patient would like baby to be seen at [facility]   S < D - Measuring 35 cm today. Attempted to schedule growth sono through [facility] but not able to be seen on time.   Rubella NI- needs MMR PP.    Patient will need BPP scheduled at follow up.   Follow up 1 week.

## 2021-07-04 NOTE — Progress Notes (Deleted)
  [place] Prenatal Visit  Tammy Cherry is a 26 y.o. G4W1027 at [redacted]w[redacted]d here for routine follow up. She is dated by LMP.  She reports {symptoms; pregnancy related:14538}. She reports fetal movement. She denies vaginal bleeding, contractions, or loss of fluid. See flow sheet for details.  There were no vitals filed for this visit. ***  A/P: Pregnancy at [redacted]w[redacted]d.  Doing well.   Routine prenatal care:  Dating reviewed, dating tab is {correct:23336::"correct"} Fetal heart tones {appropriate:23337} Fundal height {fundal height:23342::"within expected range. "} Fetal position confirmed {vertex:23350::"Vertex"} using {ultrasound or [place]:23351::"Ultrasound "}.  Infant feeding choice: Both  Contraception choice: Nexplanon  outpatient or HD  Infant circumcision desired not applicable Pain control in labor discussed and patient desires ***.  Influenza vaccine not administered as not influenza season.   Tdap previously administered between 27-36 weeks  GBS and gc/chlamydia testing results {were/were OZD:66440} reviewed today.   Pregnancy education regarding labor, fetal movement,  benefits of breastfeeding, contraception, and safe infant sleep were discussed.  Labor and fetal movement precautions reviewed. Induction of labor discussed. Scheduled for induction at approximately 41 weeks. BPP scheduled between 40-41 weeks. ***  2. Pregnancy issues include the following and were addressed as appropriate today:  Iron deficiency anemia: *** Size < dates: Scheduled for f/u growth U/S given hx of SGA infants. ***   Episodes of CP: Previously evaluated in MAU with reassuring cardiac work up. *** Hx of Pyelonephritis requiring admission. Now on cefeclor daily for prophylaxis.  GBS ***  Rubella non-immune: offer MMR post-partum  Problem list and pregnancy box updated: {yes/no:20286::"Yes"}.   Follow up 1 week.

## 2021-07-05 LAB — CERVICOVAGINAL ANCILLARY ONLY
Chlamydia: NEGATIVE
Comment: NEGATIVE
Comment: NEGATIVE
Comment: NORMAL
Neisseria Gonorrhea: NEGATIVE
Trichomonas: NEGATIVE

## 2021-07-06 ENCOUNTER — Inpatient Hospital Stay (HOSPITAL_COMMUNITY)
Admission: AD | Admit: 2021-07-06 | Discharge: 2021-07-08 | DRG: 806 | Disposition: A | Discharge status: Home / Self Care | Payer: Medicaid Other | Attending: Obstetrics & Gynecology | Admitting: Obstetrics & Gynecology

## 2021-07-06 ENCOUNTER — Encounter (HOSPITAL_COMMUNITY): Payer: Self-pay | Admitting: Obstetrics & Gynecology

## 2021-07-06 DIAGNOSIS — Z3490 Encounter for supervision of normal pregnancy, unspecified, unspecified trimester: Secondary | ICD-10-CM

## 2021-07-06 DIAGNOSIS — O9882 Other maternal infectious and parasitic diseases complicating childbirth: Principal | ICD-10-CM | POA: Diagnosis present

## 2021-07-06 DIAGNOSIS — Z23 Encounter for immunization: Secondary | ICD-10-CM

## 2021-07-06 DIAGNOSIS — R8271 Bacteriuria: Secondary | ICD-10-CM | POA: Diagnosis present

## 2021-07-06 DIAGNOSIS — Z3A38 38 weeks gestation of pregnancy: Secondary | ICD-10-CM | POA: Diagnosis not present

## 2021-07-06 DIAGNOSIS — Z2839 Other underimmunization status: Secondary | ICD-10-CM

## 2021-07-06 DIAGNOSIS — Z758 Other problems related to medical facilities and other health care: Secondary | ICD-10-CM | POA: Diagnosis present

## 2021-07-06 DIAGNOSIS — O26893 Other specified pregnancy related conditions, third trimester: Secondary | ICD-10-CM | POA: Diagnosis present

## 2021-07-06 DIAGNOSIS — Z20822 Contact with and (suspected) exposure to covid-19: Secondary | ICD-10-CM | POA: Diagnosis present

## 2021-07-06 DIAGNOSIS — N12 Tubulo-interstitial nephritis, not specified as acute or chronic: Secondary | ICD-10-CM | POA: Diagnosis present

## 2021-07-06 DIAGNOSIS — Z87440 Personal history of urinary (tract) infections: Secondary | ICD-10-CM

## 2021-07-06 DIAGNOSIS — R7309 Other abnormal glucose: Secondary | ICD-10-CM | POA: Diagnosis present

## 2021-07-06 DIAGNOSIS — O2300 Infections of kidney in pregnancy, unspecified trimester: Secondary | ICD-10-CM | POA: Diagnosis present

## 2021-07-06 DIAGNOSIS — Z789 Other specified health status: Secondary | ICD-10-CM | POA: Diagnosis present

## 2021-07-06 LAB — RESP PANEL BY RT-PCR (FLU A&B, COVID) ARPGX2
Influenza A by PCR: NEGATIVE
Influenza B by PCR: NEGATIVE
SARS Coronavirus 2 by RT PCR: NEGATIVE

## 2021-07-06 LAB — URINALYSIS, ROUTINE W REFLEX MICROSCOPIC
Bilirubin Urine: NEGATIVE
Glucose, UA: NEGATIVE mg/dL
Ketones, ur: 20 mg/dL — AB
Nitrite: POSITIVE — AB
Protein, ur: NEGATIVE mg/dL
Specific Gravity, Urine: 1.011 (ref 1.005–1.030)
pH: 6 (ref 5.0–8.0)

## 2021-07-06 LAB — CBC
HCT: 31.8 % — ABNORMAL LOW (ref 36.0–46.0)
Hemoglobin: 10.1 g/dL — ABNORMAL LOW (ref 12.0–15.0)
MCH: 22.6 pg — ABNORMAL LOW (ref 26.0–34.0)
MCHC: 31.8 g/dL (ref 30.0–36.0)
MCV: 71.3 fL — ABNORMAL LOW (ref 80.0–100.0)
Platelets: 376 10*3/uL (ref 150–400)
RBC: 4.46 MIL/uL (ref 3.87–5.11)
RDW: 16.4 % — ABNORMAL HIGH (ref 11.5–15.5)
WBC: 16.7 10*3/uL — ABNORMAL HIGH (ref 4.0–10.5)
nRBC: 0 % (ref 0.0–0.2)

## 2021-07-06 LAB — TYPE AND SCREEN
ABO/RH(D): O POS
Antibody Screen: NEGATIVE

## 2021-07-06 LAB — CULTURE, BETA STREP (GROUP B ONLY): Strep Gp B Culture: NEGATIVE

## 2021-07-06 MED ORDER — TRANEXAMIC ACID-NACL 1000-0.7 MG/100ML-% IV SOLN
INTRAVENOUS | Status: AC
  Administered 2021-07-06: 1000 mg
  Filled 2021-07-06: qty 100

## 2021-07-06 MED ORDER — LACTATED RINGERS IV SOLN: INTRAVENOUS | Status: DC

## 2021-07-06 MED ORDER — SOD CITRATE-CITRIC ACID 500-334 MG/5ML PO SOLN: 30.0000 mL | ORAL | Status: DC | PRN

## 2021-07-06 MED ORDER — ACETAMINOPHEN 325 MG PO TABS
650.0000 mg | ORAL_TABLET | ORAL | Status: DC | PRN
  Administered 2021-07-06: 650 mg via ORAL
  Filled 2021-07-06: qty 2

## 2021-07-06 MED ORDER — MISOPROSTOL 200 MCG PO TABS
ORAL_TABLET | ORAL | Status: AC
  Filled 2021-07-06: qty 4

## 2021-07-06 MED ORDER — METHYLERGONOVINE MALEATE 0.2 MG/ML IJ SOLN
INTRAMUSCULAR | Status: AC
  Filled 2021-07-06: qty 1

## 2021-07-06 MED ORDER — METHYLERGONOVINE MALEATE 0.2 MG PO TABS
0.2000 mg | ORAL_TABLET | Freq: Once | ORAL | Status: AC
  Administered 2021-07-07: 0.2 mg via ORAL
  Filled 2021-07-06: qty 1

## 2021-07-06 MED ORDER — OXYTOCIN-SODIUM CHLORIDE 30-0.9 UT/500ML-% IV SOLN
2.5000 [IU]/h | INTRAVENOUS | Status: DC
  Filled 2021-07-06: qty 500

## 2021-07-06 MED ORDER — MISOPROSTOL 200 MCG PO TABS
400.0000 ug | ORAL_TABLET | Freq: Once | ORAL | Status: AC
  Administered 2021-07-06: 400 ug via RECTAL

## 2021-07-06 MED ORDER — OXYCODONE-ACETAMINOPHEN 5-325 MG PO TABS
1.0000 | ORAL_TABLET | ORAL | Status: DC | PRN
  Administered 2021-07-06: 1 via ORAL
  Filled 2021-07-06: qty 1

## 2021-07-06 MED ORDER — TRANEXAMIC ACID-NACL 1000-0.7 MG/100ML-% IV SOLN: 1000.0000 mg | INTRAVENOUS | Status: DC

## 2021-07-06 MED ORDER — MISOPROSTOL 200 MCG PO TABS
400.0000 ug | ORAL_TABLET | Freq: Once | ORAL | Status: AC
  Administered 2021-07-06: 400 ug via BUCCAL

## 2021-07-06 MED ORDER — METHYLERGONOVINE MALEATE 0.2 MG/ML IJ SOLN
0.2000 mg | Freq: Once | INTRAMUSCULAR | Status: AC
  Administered 2021-07-06: 0.2 mg via INTRAMUSCULAR

## 2021-07-06 MED ORDER — OXYTOCIN BOLUS FROM INFUSION
333.0000 mL | Freq: Once | INTRAVENOUS | Status: AC
Start: 2021-07-06 — End: 2021-07-06
  Administered 2021-07-06: 333 mL via INTRAVENOUS

## 2021-07-06 MED ORDER — OXYCODONE-ACETAMINOPHEN 5-325 MG PO TABS: 2.0000 | ORAL_TABLET | ORAL | Status: DC | PRN

## 2021-07-06 MED ORDER — LIDOCAINE HCL (PF) 1 % IJ SOLN: 30.0000 mL | INTRAMUSCULAR | Status: DC | PRN

## 2021-07-06 MED ORDER — ONDANSETRON HCL 4 MG/2ML IJ SOLN: 4.0000 mg | Freq: Four times a day (QID) | INTRAMUSCULAR | Status: DC | PRN

## 2021-07-06 MED ORDER — LACTATED RINGERS IV SOLN: 500.0000 mL | INTRAVENOUS | Status: DC | PRN

## 2021-07-06 MED ORDER — SODIUM CHLORIDE 0.9 % IV SOLN
2.0000 g | INTRAVENOUS | Status: DC
  Administered 2021-07-06: 2 g via INTRAVENOUS
  Filled 2021-07-06 (×3): qty 20

## 2021-07-06 MED ORDER — FENTANYL CITRATE (PF) 100 MCG/2ML IJ SOLN
50.0000 ug | INTRAMUSCULAR | Status: DC | PRN
  Administered 2021-07-06: 100 ug via INTRAVENOUS
  Filled 2021-07-06: qty 2

## 2021-07-06 NOTE — Progress Notes (Signed)
Patient ID: Tammy Cherry, female   DOB: 1995-12-21, 26 y.o.   MRN: [id number] Called by RN to come assess bleeding FF, small clot removed from LUS, bleeding stable Keep on L&D another 64mins and then transfer as long as bleeding stable Roma Schanz, CNM, [facility] 07/06/2021 10:32 PM

## 2021-07-06 NOTE — H&P (Signed)
Tammy Cherry is a 26 y.o. [phone] female at [redacted]w[redacted]d by LMP c/w 30wk u/s presenting in active labor.  Contractions since Friday, worsened yesterday. Was 1.5cm on Friday, now 6.5cm. Fever/body aches began yesterday. Home temp 100.4, took apap 500mg  @ 1600. Has had recurrent UTIs during pregnancy, admitted 5/21 w/ pyelo. Feels like she has another UTI. No flank pain. UA +nitrates today.  Reports active fetal movement, contractions: regular, vaginal bleeding: none, membranes: intact.  Initiated prenatal care at [facility] at ~21 wks.    This pregnancy complicated by: Recurrent Klebsiella UTIs, admission for pyelo 5/21-5/23, neg urine cx 6/1, on keflex suppression  Prenatal History/Complications:  Term uncomplicated SVB x 2  Past Medical History: Past Medical History:  Diagnosis Date   Anemia    Gall stones     Past Surgical History: Past Surgical History:  Procedure Laterality Date   NO PAST SURGERIES      Obstetrical History: OB History     Gravida  3   Para  2   Term  2   Preterm      AB      Living  2      SAB      IAB      Ectopic      Multiple      Live Births  2           Social History: Social History   Socioeconomic History   Marital status: Significant Other    Spouse name: Not on file   Number of children: Not on file   Years of education: Not on file   Highest education level: Not on file  Occupational History   Not on file  Tobacco Use   Smoking status: Never   Smokeless tobacco: Never  Vaping Use   Vaping Use: Never used  Substance and Sexual Activity   Alcohol use: Never   Drug use: Never   Sexual activity: Yes    Birth control/protection: None  Other Topics Concern   Not on file  Social History Narrative   Not on file   Social Determinants of Health   Financial Resource Strain: Not on file  Food Insecurity: Not on file  Transportation Needs: Not on file  Physical Activity: Not on file  Stress: Not on file   Social Connections: Not on file    Family History: History reviewed. No pertinent family history.  Allergies: No Known Allergies  Medications Prior to Admission  Medication Sig Dispense Refill Last Dose   aspirin EC 81 MG tablet Take 1 tablet (81 mg total) by mouth daily. Swallow whole. 30 tablet 11 07/05/2021   ferrous sulfate 325 (65 FE) MG tablet Take 1 tablet (325 mg total) by mouth every other day. 30 tablet 3 07/06/2021   cefaclor (CECLOR) 250 MG capsule Take 1 capsule (250 mg total) by mouth daily. 30 capsule 1    cefadroxil (DURICEF) 500 MG capsule Take 1 capsule (500 mg total) by mouth 2 (two) times daily. (Patient not taking: Reported on 06/24/2021) 14 capsule 0    famotidine (PEPCID) 20 MG tablet Take 1 tablet (20 mg total) by mouth 2 (two) times daily. 30 tablet 1    oxyCODONE (OXY IR/ROXICODONE) 5 MG immediate release tablet Take 1 tablet (5 mg total) by mouth every 6 (six) hours as needed for severe pain. (Patient not taking: Reported on 06/24/2021) 12 tablet 0    Prenatal Vit-Fe Fumarate-FA (MULTIVITAMIN-PRENATAL) 27-0.8 MG TABS tablet Take 1  tablet by mouth daily at 12 noon.       Review of Systems  Pertinent pos/neg as indicated in HPI  Blood pressure 133/84, pulse (!) 109, temperature 98.3 F (36.8 C), temperature source Oral, resp. rate 18, height 5\' 4"  (1.626 m), weight 66.7 kg, last menstrual period 10/10/2020, SpO2 99 %. General appearance: alert, cooperative, and no distress Lungs: clear to auscultation bilaterally Heart: regular rate and rhythm Abdomen: gravid, soft, non-tender Extremities: tr edema  Fetal monitoring: FHR: 145 bpm, variability: moderate,  Accelerations: Present,  decelerations:  Absent Uterine activity: q 2-736mins Dilation: 8.5 Effacement (%): 90 Station: Plus 2 Exam by:: Genella RifeK. Leannah Guse, CNM Presentation: cephalic   Prenatal labs: ABO, Rh: --/--/PENDING (06/13 1950) Antibody: PENDING (06/13 1950) Rubella: <0.90 (02/06 1449) RPR: NON REACTIVE  (05/21 2150)  HBsAg: Negative (02/06 1449)  HIV: Non Reactive (04/03 1504)  GBS: Negative/-- (06/09 1540)  1hr glucola: 136, 3hr GTT wnl  Prenatal Transfer Tool  Maternal Diabetes: No Genetic Screening: Declined Maternal Ultrasounds/Referrals: Normal Fetal Ultrasounds or other Referrals:  None Maternal Substance Abuse:  No Significant Maternal Medications:  Meds include: Other: keflex suppression Significant Maternal Lab Results: Group B Strep negative  Results for orders placed or performed during the hospital encounter of 07/06/21 (from the past 24 hour(s))  Urinalysis, Routine w reflex microscopic Urine, Clean Catch   Collection Time: 07/06/21  7:20 PM  Result Value Ref Range   Color, Urine YELLOW YELLOW   APPearance HAZY (A) CLEAR   Specific Gravity, Urine 1.011 1.005 - 1.030   pH 6.0 5.0 - 8.0   Glucose, UA NEGATIVE NEGATIVE mg/dL   Hgb urine dipstick MODERATE (A) NEGATIVE   Bilirubin Urine NEGATIVE NEGATIVE   Ketones, ur 20 (A) NEGATIVE mg/dL   Protein, ur NEGATIVE NEGATIVE mg/dL   Nitrite POSITIVE (A) NEGATIVE   Leukocytes,Ua LARGE (A) NEGATIVE   RBC / HPF 6-10 0 - 5 RBC/hpf   WBC, UA 21-50 0 - 5 WBC/hpf   Bacteria, UA MANY (A) NONE SEEN   Squamous Epithelial / LPF 6-10 0 - 5   Mucus PRESENT   CBC   Collection Time: 07/06/21  7:50 PM  Result Value Ref Range   WBC 16.7 (H) 4.0 - 10.5 K/uL   RBC 4.46 3.87 - 5.11 MIL/uL   Hemoglobin 10.1 (L) 12.0 - 15.0 g/dL   HCT 40.931.8 (L) 81.136.0 - 91.446.0 %   MCV 71.3 (L) 80.0 - 100.0 fL   MCH 22.6 (L) 26.0 - 34.0 pg   MCHC 31.8 30.0 - 36.0 g/dL   RDW 78.216.4 (H) 95.611.5 - 21.315.5 %   Platelets 376 150 - 400 K/uL   nRBC 0.0 0.0 - 0.2 %  Type and screen [facility]   Collection Time: 07/06/21  7:50 PM  Result Value Ref Range   ABO/RH(D) PENDING    Antibody Screen PENDING    Sample Expiration      07/09/2021,2359 Performed at [facility], [address] [place], [place] [address]      Assessment:  9067w3d  SIUP  [id number]  Active labor  Fever/chills, UTI sx x1d w/ +nitrates, ?early pyelo  Cat 1 FHR  GBS Negative/-- (06/09 1540)  Plan:  Admit to L&D  IV pain meds/epidural prn active labor  Expectant management  Discussed w/ Dr. Debroah LoopArnold, will tx for pyelo, Rocephin 2mg  IV q24hr  Anticipate NSVB   Plans to breastfeed  Contraception: outpatient Nexplanon  Circumcision: n/a  Cheral MarkerKimberly R Amaris Garrette CNM, WHNP-BC  07/06/2021, 8:35 PM

## 2021-07-06 NOTE — Discharge Summary (Signed)
Postpartum Discharge Summary     Patient Name: Tammy Cherry DOB: March 26, 1995 MRN: [id number]  Date of admission: 07/06/2021 Delivery date:07/06/2021  Delivering provider: Wells Guiles Cherry  Date of discharge: 07/08/2021  Admitting diagnosis: Indication for care in labor and delivery, antepartum [O75.9] Intrauterine pregnancy: [redacted]w[redacted]d     Secondary diagnosis:  Principal Problem:   Vaginal delivery Active Problems:   Rubella non-immune status, antepartum   Asymptomatic bacteriuria during pregnancy   Supervision of normal pregnancy   Glucose tolerance test abnormal   Pyelonephritis affecting pregnancy   Language barrier  Additional problems: Recurrent UTIs, presumed pyelonephritis on admission    Discharge diagnosis: Term Pregnancy Delivered, pyelo                                             Post partum procedures: None Augmentation:  None Complications: Boggy uterus/clots; received Cytotec/TXA/Methergine, QBL 21ml  Hospital course: Onset of Labor With Vaginal Delivery      [name] was admitted in Active Labor on 07/06/2021. She also had fever/chills/UTI symptoms x1 day w/ +nitrites on this admission and was treated for presumed pyelonephritis. She had a normal vaginal delivery.   Membrane Rupture Time/Date: 8:40 PM ,07/06/2021   Delivery Method:Vaginal, Spontaneous  Episiotomy: None  Lacerations:  None  Patient had an uncomplicated postpartum course.  She was treated with Rocephin 2gm IV q24hr x 2 for pyelonephritis while inpatient with resolution of her symptoms. She will transition to Duricef 500 mg BID upon discharge to complete a 10 day course. She is ambulating, tolerating a regular diet, passing flatus, and urinating well.  Her pain and bleeding are controlled.  She is breastfeeding well.  Patient is discharged home in stable condition on 07/08/21.  Newborn Data: Birth date:07/06/2021  Birth time:8:49 PM  Gender:Female  Living  status:Living  Apgars:9 ,9  Weight:2990 g   Magnesium Sulfate received: No BMZ received: No Rhophylac: N/A MMR: Offered postpartum  T-DaP: Offered postpartum  Flu: Received prenatally Transfusion: No  Physical exam  Vitals:   07/07/21 0900 07/07/21 1451 07/07/21 2139 07/08/21 0500  BP: $Re'98/68 94/61 97/67 'zjU$ 100/68  Pulse: 78 65 80 70  Resp: $Remo'17 18 18 18  'DzyLM$ Temp: 98.4 F (36.9 C) 97.9 F (36.6 C) 98.1 F (36.7 C) 98.2 F (36.8 C)  TempSrc: Oral Oral Oral Oral  SpO2:  99% 99% 98%  Weight:      Height:       General: alert, cooperative, and no distress Lochia: appropriate Uterine Fundus: firm and below umbilicus  Abdomen: soft, nontender, nondistended, no CVA tenderness bilaterally  DVT Evaluation: no LE edema or calf tenderness to palpation   Labs: Lab Results  Component Value Date   WBC 16.7 (H) 07/06/2021   HGB 10.1 (L) 07/06/2021   HCT 31.8 (L) 07/06/2021   MCV 71.3 (L) 07/06/2021   PLT 376 07/06/2021      Latest Ref Rng & Units 06/24/2021   11:56 AM  CMP  Glucose 70 - 99 mg/dL 75   BUN 6 - 20 mg/dL 6   Creatinine 0.44 - 1.00 mg/dL 0.56   Sodium 135 - 145 mmol/L 136   Potassium 3.5 - 5.1 mmol/L 3.9   Chloride 98 - 111 mmol/L 107   CO2 22 - 32 mmol/L 21   Calcium 8.9 - 10.3 mg/dL 8.7  Total Protein 6.5 - 8.1 g/dL 6.7   Total Bilirubin 0.3 - 1.2 mg/dL 0.5   Alkaline Phos 38 - 126 U/L 216   AST 15 - 41 U/L 19   ALT 0 - 44 U/L 16    Edinburgh Score:     No data to display           After visit meds:  Allergies as of 07/08/2021   No Known Allergies      Medication List     STOP taking these medications    aspirin EC 81 MG tablet   cefaclor 250 MG capsule Commonly known as: CECLOR   famotidine 20 MG tablet Commonly known as: PEPCID   oxyCODONE 5 MG immediate release tablet Commonly known as: Oxy IR/ROXICODONE       TAKE these medications    acetaminophen 500 MG tablet Commonly known as: TYLENOL Take 2 tablets (1,000 mg total) by  mouth every 8 (eight) hours as needed (pain).   cefadroxil 500 MG capsule Commonly known as: DURICEF Take 1 capsule (500 mg total) by mouth 2 (two) times daily for 8 days.   ferrous sulfate 325 (65 FE) MG tablet Take 1 tablet (325 mg total) by mouth every other day.   ibuprofen 600 MG tablet Commonly known as: ADVIL Take 1 tablet (600 mg total) by mouth every 6 (six) hours as needed (pain).   multivitamin-prenatal 27-0.8 MG Tabs tablet Take 1 tablet by mouth daily at 12 noon.        Discharge home in stable condition Infant Feeding: Breast Infant Disposition: home with mother Discharge instruction: per After Visit Summary and Postpartum booklet. Activity: Advance as tolerated. Pelvic rest for 6 weeks.  Diet: routine diet Future Appointments: Future Appointments  Date Time Provider [place]  07/09/2021  8:50 AM Tammy Settler, DO [facility] [place]   Follow up Visit: Tammy Cherry, CNM  P [facility] Admin Please schedule this patient for PP visit in: 4-6 weeks  Low risk pregnancy complicated by: Recurrent UTIs, pyelonephritis  Delivery mode:  SVD  Anticipated Birth Control: Nexplanon outpatient  PP Procedures needed: None  Schedule Integrated BH visit: No  Provider: Any provider   AM[employer] Spanish interpreter [phone]) used for entirety of encounter.   07/08/2021 Tammy Del, MD

## 2021-07-06 NOTE — Progress Notes (Signed)
Patient ID: Britni Driscoll, female   DOB: 1995-10-24, 26 y.o.   MRN: [id number] Called by RN to assess bleeding, so I also called Dr. Debroah Loop BP 117/77   Pulse 96   Temp (!) 100.5 F (38.1 C) (Axillary)   Resp 18   Ht 5\' 4"  (1.626 m)   Wt 66.7 kg   LMP 10/10/2020 (Exact Date)   SpO2 97%   Breastfeeding Unknown   BMI 25.23 kg/m   FF, trickle on perineum, no clots in lower vagina on his exam Total EBL 10/12/2020 Can give 1st dose of po methergine series now Per Dr. , stable and ok to go to [facility] Debroah Loop, CNM, [facility] 07/06/2021 11:42 PM

## 2021-07-06 NOTE — MAU Note (Addendum)
.  Tammy Cherry Tammy Cherry is a 26 y.o. at [redacted]w[redacted]d here in MAU reporting ctxs since Friday but stronger since yesterday. Also reports fever and body aches since yesterday. States temp was 100.4 at home and took Tylenol 500mg  tab at 1600. Denies VB or LOF. Was 1.5cm on Friday. Denies n/ v/d Onset of complaint: yesterday Pain score: 10 Vitals:   07/06/21 1912  Pulse: (!) 112  Resp: 18  Temp: 98.3 F (36.8 C)  SpO2: 99%     FHT:158 Lab orders placed from triage:

## 2021-07-07 LAB — RPR: RPR Ser Ql: NONREACTIVE

## 2021-07-07 MED ORDER — WITCH HAZEL-GLYCERIN EX PADS: 1.0000 "application " | MEDICATED_PAD | CUTANEOUS | Status: DC | PRN

## 2021-07-07 MED ORDER — DIBUCAINE (PERIANAL) 1 % EX OINT: 1.0000 "application " | TOPICAL_OINTMENT | CUTANEOUS | Status: DC | PRN

## 2021-07-07 MED ORDER — SODIUM CHLORIDE 0.9% FLUSH: 3.0000 mL | INTRAVENOUS | Status: DC | PRN

## 2021-07-07 MED ORDER — ACETAMINOPHEN 325 MG PO TABS: 650.0000 mg | ORAL_TABLET | ORAL | Status: DC | PRN

## 2021-07-07 MED ORDER — ZOLPIDEM TARTRATE 5 MG PO TABS: 5.0000 mg | ORAL_TABLET | Freq: Every evening | ORAL | Status: DC | PRN

## 2021-07-07 MED ORDER — FLEET ENEMA 7-19 GM/118ML RE ENEM: 1.0000 | ENEMA | Freq: Every day | RECTAL | Status: DC | PRN

## 2021-07-07 MED ORDER — DIPHENHYDRAMINE HCL 25 MG PO CAPS: 25.0000 mg | ORAL_CAPSULE | Freq: Four times a day (QID) | ORAL | Status: DC | PRN

## 2021-07-07 MED ORDER — BISACODYL 10 MG RE SUPP: 10.0000 mg | Freq: Every day | RECTAL | Status: DC | PRN

## 2021-07-07 MED ORDER — BENZOCAINE-MENTHOL 20-0.5 % EX AERO: 1.0000 "application " | INHALATION_SPRAY | CUTANEOUS | Status: DC | PRN

## 2021-07-07 MED ORDER — ONDANSETRON HCL 4 MG PO TABS: 4.0000 mg | ORAL_TABLET | ORAL | Status: DC | PRN

## 2021-07-07 MED ORDER — SIMETHICONE 80 MG PO CHEW: 80.0000 mg | CHEWABLE_TABLET | ORAL | Status: DC | PRN

## 2021-07-07 MED ORDER — SODIUM CHLORIDE 0.9 % IV SOLN: 250.0000 mL | INTRAVENOUS | Status: DC | PRN

## 2021-07-07 MED ORDER — PRENATAL MULTIVITAMIN CH
1.0000 | ORAL_TABLET | Freq: Every day | ORAL | Status: DC
  Administered 2021-07-07 – 2021-07-08 (×2): 1 via ORAL
  Filled 2021-07-07 (×2): qty 1

## 2021-07-07 MED ORDER — MEASLES, MUMPS & RUBELLA VAC IJ SOLR
0.5000 mL | Freq: Once | INTRAMUSCULAR | Status: AC
  Administered 2021-07-08: 0.5 mL via SUBCUTANEOUS
  Filled 2021-07-07: qty 0.5

## 2021-07-07 MED ORDER — SODIUM CHLORIDE 0.9% FLUSH: 3.0000 mL | Freq: Two times a day (BID) | INTRAVENOUS | Status: DC

## 2021-07-07 MED ORDER — IBUPROFEN 600 MG PO TABS
600.0000 mg | ORAL_TABLET | Freq: Four times a day (QID) | ORAL | Status: DC
  Administered 2021-07-07 – 2021-07-08 (×4): 600 mg via ORAL
  Filled 2021-07-07 (×4): qty 1

## 2021-07-07 MED ORDER — ONDANSETRON HCL 4 MG/2ML IJ SOLN: 4.0000 mg | INTRAMUSCULAR | Status: DC | PRN

## 2021-07-07 MED ORDER — TETANUS-DIPHTH-ACELL PERTUSSIS 5-2.5-18.5 LF-MCG/0.5 IM SUSY: 0.5000 mL | PREFILLED_SYRINGE | Freq: Once | INTRAMUSCULAR | Status: DC

## 2021-07-07 MED ORDER — COCONUT OIL OIL: 1.0000 "application " | TOPICAL_OIL | Status: DC | PRN

## 2021-07-07 MED ORDER — METHYLERGONOVINE MALEATE 0.2 MG PO TABS
0.2000 mg | ORAL_TABLET | ORAL | Status: AC
  Administered 2021-07-07 (×3): 0.2 mg via ORAL
  Filled 2021-07-07 (×3): qty 1

## 2021-07-07 MED ORDER — SENNOSIDES-DOCUSATE SODIUM 8.6-50 MG PO TABS
2.0000 | ORAL_TABLET | Freq: Every day | ORAL | Status: DC
  Administered 2021-07-07 – 2021-07-08 (×2): 2 via ORAL
  Filled 2021-07-07 (×2): qty 2

## 2021-07-07 NOTE — Progress Notes (Signed)
Post Partum Day 1 Subjective: Eating, drinking well. Hasn't voided since leaving L&D. Abdomen sore. +flatus.  Lochia and pain wnl.  Denies dizziness, lightheadedness, or sob. Denies fever/chills right now.   Objective: Blood pressure 109/67, pulse 95, temperature 98.7 F (37.1 C), temperature source Oral, resp. rate 18, height 5\' 4"  (1.626 m), weight 66.7 kg, last menstrual period 10/10/2020, SpO2 100 %, unknown if currently breastfeeding.  Physical Exam:  General: alert, cooperative, and no distress Lochia: appropriate Uterine Fundus: firm Incision: n/a DVT Evaluation: No evidence of DVT seen on physical exam. Negative Homan's sign. No cords or calf tenderness. No significant calf/ankle edema.  Recent Labs    07/06/21 1950  HGB 10.1*  HCT 31.8*    Assessment/Plan: Last fever 100.5 @ 2323, received apap. Continue rocephin 2gm IV q24hr for presumed pyelo. Urine cx pending. RN to help pt to bathroom to void, let 07/08/21 know if any issues.    LOS: 1 day   [place] 07/07/2021, 6:32 AM

## 2021-07-07 NOTE — Lactation Note (Signed)
This note was copied from a baby's chart. Lactation Consultation Note  Patient Name: Tammy Cherry DGLOV'F Date: 07/07/2021 Reason for consult: Initial assessment;Early term 37-38.6wks Age:25 hours  LC in to visit with P3 Mom of ET infant.  Baby at 1 % weight loss, 1 voids and a few emesis noted.  Baby was stirring a little swaddled in crib.  Offered to assist with a latch to the breast.  Mom had just fed baby for 10 mins at 8:30 am.  Baby placed STS on Mom's chest and baby burped and settled down to sleep.  Hand expression reviewed for colostrum drops.  No rooting noted.  LC noted that baby felt a little cool.  Axillary temp was 97.6  Encouraged Mom to keep baby STS today as much as possible, offering the breast when baby is showing feeding cues.  Mom encouraged to call for her nurse if help needed.  Maternal Data Has patient been taught Hand Expression?: Yes Does the patient have breastfeeding experience prior to this delivery?: Yes How long did the patient breastfeed?: 2 years with her first 2 babies  Feeding Mother's Current Feeding Choice: Breast Milk and Formula  Interventions Interventions: Breast feeding basics reviewed;Skin to skin;Breast massage;Hand express;LC Services brochure  Consult Status Consult Status: Follow-up Date: 07/08/21 Follow-up type: In-patient    Tammy Cherry 07/07/2021, 9:11 AM

## 2021-07-08 ENCOUNTER — Other Ambulatory Visit (HOSPITAL_COMMUNITY): Payer: Self-pay

## 2021-07-08 LAB — URINE CULTURE: Culture: >=100000 — AB

## 2021-07-08 MED ORDER — IBUPROFEN 600 MG PO TABS
600.0000 mg | ORAL_TABLET | Freq: Four times a day (QID) | ORAL | 0 refills | Status: DC | PRN
  Filled 2021-07-08: qty 40, 10d supply, fill #0

## 2021-07-08 MED ORDER — CEFADROXIL 500 MG PO CAPS
500.0000 mg | ORAL_CAPSULE | Freq: Two times a day (BID) | ORAL | 0 refills | Status: AC
  Filled 2021-07-08: qty 16, 8d supply, fill #0

## 2021-07-08 MED ORDER — ACETAMINOPHEN 500 MG PO TABS
1000.0000 mg | ORAL_TABLET | Freq: Three times a day (TID) | ORAL | 0 refills | Status: DC | PRN
  Filled 2021-07-08: qty 60, 10d supply, fill #0

## 2021-07-08 NOTE — Lactation Note (Signed)
This note was copied from a baby's chart. Lactation Consultation Note  Patient Name: Tammy Cherry VVOHY'W Date: 07/08/2021 Reason for consult: Follow-up assessment;Early term 85-38.6wks Age:25 hours   P3 mother whose infant is now 52 hours old.  This is an early term infant at 38+3 weeks.  Mother's current feeding preference is breast/formula.  Spanish interpreter, [place] [phone]) used for interpretation.  Mother had baby latched when I arrived.  Baby appeared to have a good latch; wide gape and flanged lips; good sucking rhythm.  Mother denied pain with feeding.  She has successfully breast fed her other two children.  Baby is voiding/stooling.  Mother had no questions/concerns at this time.  Father present.  Family has our OP phone number for any questions after discharge.   Maternal Data    Feeding Mother's Current Feeding Choice: Breast Milk and Formula  LATCH Score Latch: Grasps breast easily, tongue down, lips flanged, rhythmical sucking.  Audible Swallowing: A few with stimulation  Type of Nipple:  (Not assessed due to baby being latched when i arrived)  Comfort (Breast/Nipple): Soft / non-tender  Hold (Positioning): No assistance needed to correctly position infant at breast.      Lactation Tools Discussed/Used    Interventions Interventions: Breast feeding basics reviewed;Education  Discharge Discharge Education: Engorgement and breast care Pump: Personal  Consult Status Consult Status: Complete Date: 07/08/21 Follow-up type: Call as needed    Chantelle Verdi R [name] 07/08/2021, 8:53 AM

## 2021-07-09 ENCOUNTER — Encounter: Payer: Self-pay | Admitting: Family Medicine

## 2021-07-09 LAB — SURGICAL PATHOLOGY

## 2021-07-14 ENCOUNTER — Telehealth (HOSPITAL_COMMUNITY): Payer: Self-pay | Admitting: *Deleted

## 2021-07-14 NOTE — Telephone Encounter (Signed)
Hospital Discharge Follow-Up Call:  Spoke to patient with help of telephonic interpreter "Byrd Hesselbach [phone]".  Patient reports that she is well and she has no concerns about her healing process.  EPDS to day was 0 and she endorses this accurately reflects that she is doing well emotionally.  Patient says that baby is well and she has no concerns about baby's health.  She reports that baby sleeps in a crib.  Reviewed ABCs of Safe Sleep.

## 2021-08-02 NOTE — Progress Notes (Signed)
   Tammy Cherry is a 26 y.o. [id number] female who presents for a postpartum visit. She is 5 weeks postpartum following a normal spontaneous vaginal delivery.  I have fully reviewed the prenatal and intrapartum course. The delivery was at [redacted]w[redacted]d gestational weeks.  Anesthesia: none. Baby is doing well. Baby is feeding by both breast and bottle - Similac . Bleeding no bleeding. Bowel function is normal. Bladder function is normal. Patient is not sexually active. Contraception method is  interested in Nexplanon . Postpartum depression screening: negative.   The pregnancy intention screening data noted above was reviewed. Potential methods of contraception were discussed. The patient elected to proceed with No data recorded.    Health Maintenance Due  Topic Date Due   HPV VACCINES (1 - 2-dose series) Never done   TETANUS/TDAP  Never done    The following portions of the patient's history were reviewed and updated as appropriate: allergies, current medications, past family history, past medical history, past social history, past surgical history, and problem list.  Review of Systems Pertinent items are noted in HPI.  Objective:  LMP 10/10/2020 (Exact Date)    General:  alert, cooperative, and no distress   Breasts:  not indicated  Lungs: clear to auscultation bilaterally  Heart:  regular rate and rhythm, S1, S2 normal, no murmur, click, rub or gallop  Abdomen: soft, non-tender; bowel sounds normal; no masses,  no organomegaly              Assessment:    There are no diagnoses linked to this encounter.  Normal postpartum exam.   Plan:   Essential components of care per ACOG recommendations:  1.  Mood and well being: Patient with negative depression screening today. Reviewed local resources for support.  - Patient tobacco use? No.   - hx of drug use? No.    2. Infant care and feeding:  -Patient currently breastmilk feeding? Yes. 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 No concernsThe   3. Sexuality, contraception and birth spacing - Patient does not want a pregnancy in the next year.  Desired family size is 2 children.  - Reviewed forms of contraception in tiered fashion. Patient desired  would like Nexplanon- will call to get this at HD .   - Discussed birth spacing of 18 months  4. Sleep and fatigue -Encouraged family/partner/community support of 4 hrs of uninterrupted sleep to help with mood and fatigue  5. Physical Recovery  - Discussed patients delivery and complications. She describes her labor as good. - Patient had a  vaginal delivery complicated by post-partum hemorrhage . Patient had  no  lacerations. Perineal healing reviewed. Patient expressed understanding - Patient has urinary incontinence? No. - Patient is safe to resume physical and sexual activity  6.  Health Maintenance - HM due items addressed No - vaccines to be given at HD - Last pap smear  Diagnosis  Date Value Ref Range Status  03/08/2021   Final   - Negative for intraepithelial lesion or malignancy (NILM)   Pap smear previously done in February- NILM but no transformation zone visualized.  -Breast Cancer screening indicated? No.   7. Chronic Disease/Pregnancy Condition follow up: Anemia.   Sabino Dick PGY-2 Family Medicine

## 2021-08-09 ENCOUNTER — Ambulatory Visit (INDEPENDENT_AMBULATORY_CARE_PROVIDER_SITE_OTHER): Payer: Self-pay | Admitting: Family Medicine

## 2021-08-09 ENCOUNTER — Other Ambulatory Visit: Payer: Self-pay

## 2021-08-09 ENCOUNTER — Encounter: Payer: Self-pay | Admitting: Family Medicine

## 2021-08-09 NOTE — Patient Instructions (Addendum)
Fue maravilloso verte hoy.  Por favor traiga TODOS sus medicamentos a cada visita.  Hoy hablamos de:  Me alegro de que lo ests haciendo bien! Usted es seguro para tener relaciones sexuales si lo desea.  -Comunquese con el Departamento de Salud para obtener su Nexplanon (implante de brazo) para Mining engineer.   Recomendara continuar con sus vitaminas prenatales.  Karl Pock por elegir Medicina familiar de [place].  Llame al [phone] si tiene alguna pregunta sobre la cita de [place].  Asegrese de programar un seguimiento en la recepcin antes de irse hoy.  Sabino Dick, D.O. PGY-3 Medicina Familiar

## 2022-09-12 LAB — OB RESULTS CONSOLE GC/CHLAMYDIA
Chlamydia: NEGATIVE
Neisseria Gonorrhea: NEGATIVE

## 2022-09-12 LAB — OB RESULTS CONSOLE RPR: RPR: NONREACTIVE

## 2022-09-12 LAB — OB RESULTS CONSOLE HEPATITIS B SURFACE ANTIGEN: Hepatitis B Surface Ag: NEGATIVE

## 2022-09-12 LAB — OB RESULTS CONSOLE HIV ANTIBODY (ROUTINE TESTING): HIV: NONREACTIVE

## 2022-09-12 LAB — OB RESULTS CONSOLE RUBELLA ANTIBODY, IGM: Rubella: IMMUNE

## 2022-09-12 LAB — HEPATITIS C ANTIBODY: HCV Ab: NEGATIVE

## 2022-09-14 ENCOUNTER — Telehealth: Payer: Self-pay

## 2022-09-14 NOTE — Telephone Encounter (Signed)
Telephoned patient at mobile number using interpreter#[phone]. Voice mail not an option. [facility] (referral)

## 2022-09-21 ENCOUNTER — Other Ambulatory Visit: Payer: Self-pay

## 2022-09-21 DIAGNOSIS — N6320 Unspecified lump in the left breast, unspecified quadrant: Secondary | ICD-10-CM

## 2022-10-13 ENCOUNTER — Ambulatory Visit
Admission: RE | Admit: 2022-10-13 | Discharge: 2022-10-13 | Disposition: A | Discharge status: Home / Self Care | Payer: PRIVATE HEALTH INSURANCE | Source: Ambulatory Visit | Attending: Obstetrics and Gynecology | Admitting: Obstetrics and Gynecology

## 2022-10-13 ENCOUNTER — Ambulatory Visit: Payer: Self-pay | Admitting: Hematology and Oncology

## 2022-10-13 VITALS — BP 121/83 | Wt 152.9 lb

## 2022-10-13 DIAGNOSIS — N6320 Unspecified lump in the left breast, unspecified quadrant: Secondary | ICD-10-CM

## 2022-10-13 NOTE — Progress Notes (Signed)
[name] is a 27 y.o. female who presents to [facility] clinic today with complaint of left breast lump.    Pap Smear: Pap not smear completed today. Last Pap smear was 03/08/2021 and was normal. Per patient has no history of an abnormal Pap smear. Last Pap smear result is available in Epic.   Physical exam: Breasts Breasts symmetrical. No skin abnormalities bilateral breasts. No nipple retraction bilateral breasts. No nipple discharge bilateral breasts. No lymphadenopathy. No lumps palpated right breasts. Small lump, mobile noted at 3 o'clock.        Pelvic/Bimanual Pap is not indicated today    Smoking History: Patient has never smoked and was not referred to quit line.    Patient Navigation: Patient education provided. Access to services provided for patient through BCCCP program. Natale Lay interpreter provided. No transportation provided   Colorectal Cancer Screening: Per patient has never had colonoscopy completed No complaints today.    Breast and Cervical Cancer Risk Assessment: Patient does not have family history of breast cancer, known genetic mutations, or radiation treatment to the chest before age 74. Patient does not have history of cervical dysplasia, immunocompromised, or DES exposure in-utero.  Risk Assessment   No risk assessment data       A: BCCCP exam without pap smear Complaint of left breast lump as noted on exam.   P: Referred patient to the [facility] for a diagnostic mammogram. Appointment scheduled 10/13/22.  Pascal Lux, NP 10/13/2022 2:27 PM

## 2022-10-13 NOTE — Patient Instructions (Signed)
Taught Tammy Cherry about self breast awareness and gave educational materials to take home. Patient did not need a Pap smear today due to last Pap smear was in 03/08/21 per patient. Told patient about free cervical cancer screenings to receive a Pap smear if would like one next year. Let her know [facility]CP will cover Pap smears every 5 years unless has a history of abnormal Pap smears. Referred patient to the [facility] for diagnostic mammogram. Appointment scheduled for 10/13/22. Patient aware of appointment and will be there. Let patient know will follow up with her within the next couple weeks with results. Carl Best [place] verbalized understanding.  Pascal Lux, NP 2:48 PM

## 2022-10-17 ENCOUNTER — Other Ambulatory Visit: Payer: Self-pay | Admitting: Obstetrics and Gynecology

## 2022-10-17 DIAGNOSIS — N6320 Unspecified lump in the left breast, unspecified quadrant: Secondary | ICD-10-CM

## 2023-01-02 LAB — OB RESULTS CONSOLE HIV ANTIBODY (ROUTINE TESTING): HIV: NONREACTIVE

## 2023-01-25 NOTE — L&D Delivery Note (Addendum)
Delivery Note At 10:11 AM a viable and healthy female was delivered via Vaginal, Spontaneous (Presentation: Left Occiput Anterior). Shoulders delivered easily. Infant placed skin-to-skin w/ mom. Delayed cord clamping x 1 minute. Cord clamped x 2 and cut by FOB. APGAR: 9, 9; weight 6 lb 5.6 oz (2880 g).   Placenta status: Spontaneous, Intact.  Cord: 3 vessels with the following complications: None.  Cord pH: NA  Anesthesia: Epidural Episiotomy: None Lacerations: None Suture Repair:  NA Est. Blood Loss (mL): 126  Mom to postpartum.  Baby to Couplet care / Skin to Skin. Placenta to: LD Feeding: Breast Circ: No Contraception: Nexplanon vs Interval BTL.   Dorathy Kinsman 02/21/2023, 1:53 PM

## 2023-02-02 LAB — OB RESULTS CONSOLE GBS: GBS: POSITIVE

## 2023-02-06 ENCOUNTER — Encounter (HOSPITAL_COMMUNITY): Payer: Self-pay | Admitting: Obstetrics & Gynecology

## 2023-02-06 ENCOUNTER — Inpatient Hospital Stay (HOSPITAL_COMMUNITY)
Admission: AD | Admit: 2023-02-06 | Discharge: 2023-02-07 | Disposition: A | Discharge status: Home / Self Care | Payer: Self-pay | Attending: Obstetrics & Gynecology | Admitting: Obstetrics & Gynecology

## 2023-02-06 DIAGNOSIS — R0989 Other specified symptoms and signs involving the circulatory and respiratory systems: Secondary | ICD-10-CM | POA: Insufficient documentation

## 2023-02-06 DIAGNOSIS — R509 Fever, unspecified: Secondary | ICD-10-CM

## 2023-02-06 DIAGNOSIS — Z3A37 37 weeks gestation of pregnancy: Secondary | ICD-10-CM | POA: Insufficient documentation

## 2023-02-06 DIAGNOSIS — O99513 Diseases of the respiratory system complicating pregnancy, third trimester: Secondary | ICD-10-CM | POA: Insufficient documentation

## 2023-02-06 DIAGNOSIS — U071 COVID-19: Secondary | ICD-10-CM | POA: Insufficient documentation

## 2023-02-06 NOTE — MAU Note (Signed)
..  Tammy Cherry is a 28 y.o. at [redacted]w[redacted]d here in MAU reporting: fever all day  Headache  Body aches and fever    Pain score:body aches 10/10; headache 8/10 Vitals:   02/06/23 2326  BP: 115/76  Pulse: (!) 121  Resp: 17  Temp: (!) 100.4 F (38 C)  SpO2: 100%     FHT:170 provider notified Lab orders placed from triage:  UA

## 2023-02-07 ENCOUNTER — Inpatient Hospital Stay (HOSPITAL_COMMUNITY): Payer: Self-pay

## 2023-02-07 DIAGNOSIS — R509 Fever, unspecified: Secondary | ICD-10-CM

## 2023-02-07 DIAGNOSIS — U071 COVID-19: Secondary | ICD-10-CM

## 2023-02-07 DIAGNOSIS — R0989 Other specified symptoms and signs involving the circulatory and respiratory systems: Secondary | ICD-10-CM

## 2023-02-07 DIAGNOSIS — Z3A37 37 weeks gestation of pregnancy: Secondary | ICD-10-CM

## 2023-02-07 DIAGNOSIS — O98513 Other viral diseases complicating pregnancy, third trimester: Secondary | ICD-10-CM

## 2023-02-07 LAB — COMPREHENSIVE METABOLIC PANEL
ALT: 11 U/L (ref 0–44)
AST: 15 U/L (ref 15–41)
Albumin: 2.3 g/dL — ABNORMAL LOW (ref 3.5–5.0)
Alkaline Phosphatase: 208 U/L — ABNORMAL HIGH (ref 38–126)
Anion gap: 9 (ref 5–15)
BUN: 5 mg/dL — ABNORMAL LOW (ref 6–20)
CO2: 18 mmol/L — ABNORMAL LOW (ref 22–32)
Calcium: 8.4 mg/dL — ABNORMAL LOW (ref 8.9–10.3)
Chloride: 107 mmol/L (ref 98–111)
Creatinine, Ser: 0.61 mg/dL (ref 0.44–1.00)
GFR, Estimated: >60 mL/min (ref >60)
Glucose, Bld: 96 mg/dL (ref 70–99)
Potassium: 3.6 mmol/L (ref 3.5–5.1)
Sodium: 134 mmol/L — ABNORMAL LOW (ref 135–145)
Total Bilirubin: 0.6 mg/dL (ref 0.0–1.2)
Total Protein: 6.4 g/dL — ABNORMAL LOW (ref 6.5–8.1)

## 2023-02-07 LAB — CBC
HCT: 29.5 % — ABNORMAL LOW (ref 36.0–46.0)
Hemoglobin: 9.5 g/dL — ABNORMAL LOW (ref 12.0–15.0)
MCH: 23.1 pg — ABNORMAL LOW (ref 26.0–34.0)
MCHC: 32.2 g/dL (ref 30.0–36.0)
MCV: 71.8 fL — ABNORMAL LOW (ref 80.0–100.0)
Platelets: 335 10*3/uL (ref 150–400)
RBC: 4.11 MIL/uL (ref 3.87–5.11)
RDW: 15.6 % — ABNORMAL HIGH (ref 11.5–15.5)
WBC: 18.5 10*3/uL — ABNORMAL HIGH (ref 4.0–10.5)
nRBC: 0 % (ref 0.0–0.2)

## 2023-02-07 LAB — RESP PANEL BY RT-PCR (RSV, FLU A&B, COVID)  RVPGX2
Influenza A by PCR: NEGATIVE
Influenza B by PCR: NEGATIVE
Resp Syncytial Virus by PCR: NEGATIVE
SARS Coronavirus 2 by RT PCR: POSITIVE — AB

## 2023-02-07 LAB — URINALYSIS, ROUTINE W REFLEX MICROSCOPIC
Bilirubin Urine: NEGATIVE
Glucose, UA: NEGATIVE mg/dL
Ketones, ur: NEGATIVE mg/dL
Nitrite: NEGATIVE
Protein, ur: NEGATIVE mg/dL
Specific Gravity, Urine: 1.011 (ref 1.005–1.030)
pH: 6 (ref 5.0–8.0)

## 2023-02-07 MED ORDER — GUAIFENESIN ER 600 MG PO TB12: 600.0000 mg | ORAL_TABLET | Freq: Two times a day (BID) | ORAL | 1 refills | Status: DC | PRN

## 2023-02-07 MED ORDER — ALUM & MAG HYDROXIDE-SIMETH 200-200-20 MG/5ML PO SUSP
30.0000 mL | Freq: Once | ORAL | Status: AC
  Administered 2023-02-07: 30 mL via ORAL
  Filled 2023-02-07: qty 30

## 2023-02-07 NOTE — MAU Provider Note (Signed)
 Chief Complaint:  Headache, Fever, and Generalized Body Aches   Event Date/Time   First Provider Initiated Contact with Patient 02/07/23 0023     HPI: Tammy Cherry is a 28 y.o. [phone] at 20w2dwho presents to maternity admissions reporting fever all day and headache. Also has body aches.  Has not tried anything for it. .  States feels like she has pressure in her chest.  She reports good fetal movement, denies LOF, vaginal bleeding, urinary symptoms, n/v, diarrhea, constipation or fever/chills.    Headache  The current episode started today. The problem occurs constantly. The pain does not radiate. The quality of the pain is described as aching. Associated symptoms include a fever, muscle aches and rhinorrhea. Pertinent negatives include no abdominal pain, back pain, blurred vision, coughing, dizziness, nausea or vomiting. Nothing aggravates the symptoms. She has tried nothing for the symptoms.  Fever  Associated symptoms include headaches and muscle aches. Pertinent negatives include no abdominal pain, coughing, nausea or vomiting.   RN Note Aryahi Denzler is a 28 y.o. at [redacted]w[redacted]d here in MAU reporting: fever all day  Headache  Body aches and fever   Past Medical History: Past Medical History:  Diagnosis Date   Anemia    Gall stones     Past obstetric history: OB History  Gravida Para Term Preterm AB Living  4 3 3   3   SAB IAB Ectopic Multiple Live Births     0 3    # Outcome Date GA Lbr Len/2nd Weight Sex Type Anes PTL Lv  4 Current           3 Term 07/06/21 [redacted]w[redacted]d 00:55 / 00:09 2990 g F Vag-Spont None  LIV  2 Term 09/20/16 [redacted]w[redacted]d   M Vag-Spont   LIV  1 Term 02/27/13 [redacted]w[redacted]d   M Vag-Spont   LIV    Past Surgical History: Past Surgical History:  Procedure Laterality Date   NO PAST SURGERIES      Family History: History reviewed. No pertinent family history.  Social History: Social History   Tobacco Use   Smoking status: Never    Smokeless tobacco: Never  Vaping Use   Vaping status: Never Used  Substance Use Topics   Alcohol use: Never   Drug use: Never    Allergies: No Known Allergies  Meds:  Medications Prior to Admission  Medication Sig Dispense Refill Last Dose/Taking   acetaminophen  (TYLENOL ) 500 MG tablet Take 2 tablets (1,000 mg total) by mouth every 8 (eight) hours as needed (pain). 60 tablet 0 02/06/2023 at  8:00 PM   ferrous sulfate  325 (65 FE) MG tablet Take 1 tablet (325 mg total) by mouth every other day. 30 tablet 3 02/05/2023   Prenatal Vit-Fe Fumarate-FA (MULTIVITAMIN-PRENATAL) 27-0.8 MG TABS tablet Take 1 tablet by mouth daily at 12 noon.   02/06/2023    I have reviewed patient's Past Medical Hx, Surgical Hx, Family Hx, Social Hx, medications and allergies.   ROS:  Review of Systems  Constitutional:  Positive for fever.  HENT:  Positive for rhinorrhea.   Eyes:  Negative for blurred vision.  Respiratory:  Negative for cough.   Gastrointestinal:  Negative for abdominal pain, nausea and vomiting.  Musculoskeletal:  Negative for back pain.  Neurological:  Positive for headaches. Negative for dizziness.   Other systems negative  Physical Exam  Patient Vitals for the past 24 hrs:  BP Temp Temp src Pulse Resp SpO2  02/06/23 2326 115/76 (!) 100.4 F (38  C) Oral (!) 121 17 100 %   Constitutional: Well-developed, well-nourished female in no acute distress.  Cardiovascular: normal rate and rhythm Respiratory: normal effort, clear to auscultation bilaterally GI: Abd soft, non-tender, gravid appropriate for gestational age.   No rebound or guarding. MS: Extremities nontender, no edema, normal ROM Neurologic: Alert and oriented x 4.  GU: Neg CVAT.   FHT:  Baseline 150 , moderate variability, accelerations present, no decelerations Contractions:  Irregular     Labs: Results for orders placed or performed during the hospital encounter of 02/06/23 (from the past 24 hours)  Urinalysis, Routine w  reflex microscopic -Urine, Clean Catch     Status: Abnormal   Collection Time: 02/07/23 12:05 AM  Result Value Ref Range   Color, Urine YELLOW YELLOW   APPearance HAZY (A) CLEAR   Specific Gravity, Urine 1.011 1.005 - 1.030   pH 6.0 5.0 - 8.0   Glucose, UA NEGATIVE NEGATIVE mg/dL   Hgb urine dipstick SMALL (A) NEGATIVE   Bilirubin Urine NEGATIVE NEGATIVE   Ketones, ur NEGATIVE NEGATIVE mg/dL   Protein, ur NEGATIVE NEGATIVE mg/dL   Nitrite NEGATIVE NEGATIVE   Leukocytes,Ua LARGE (A) NEGATIVE   RBC / HPF 0-5 0 - 5 RBC/hpf   WBC, UA 21-50 0 - 5 WBC/hpf   Bacteria, UA RARE (A) NONE SEEN   Squamous Epithelial / HPF 0-5 0 - 5 /HPF   Mucus PRESENT   Resp panel by RT-PCR (RSV, Flu A&B, Covid) Anterior Nasal Swab     Status: Abnormal   Collection Time: 02/07/23 12:14 AM   Specimen: Anterior Nasal Swab  Result Value Ref Range   SARS Coronavirus 2 by RT PCR POSITIVE (A) NEGATIVE   Influenza A by PCR NEGATIVE NEGATIVE   Influenza B by PCR NEGATIVE NEGATIVE   Resp Syncytial Virus by PCR NEGATIVE NEGATIVE  CBC     Status: Abnormal   Collection Time: 02/07/23 12:52 AM  Result Value Ref Range   WBC 18.5 (H) 4.0 - 10.5 K/uL   RBC 4.11 3.87 - 5.11 MIL/uL   Hemoglobin 9.5 (L) 12.0 - 15.0 g/dL   HCT 70.4 (L) 63.9 - 53.9 %   MCV 71.8 (L) 80.0 - 100.0 fL   MCH 23.1 (L) 26.0 - 34.0 pg   MCHC 32.2 30.0 - 36.0 g/dL   RDW 84.3 (H) 88.4 - 84.4 %   Platelets 335 150 - 400 K/uL   nRBC 0.0 0.0 - 0.2 %  Comprehensive metabolic panel     Status: Abnormal   Collection Time: 02/07/23 12:52 AM  Result Value Ref Range   Sodium 134 (L) 135 - 145 mmol/L   Potassium 3.6 3.5 - 5.1 mmol/L   Chloride 107 98 - 111 mmol/L   CO2 18 (L) 22 - 32 mmol/L   Glucose, Bld 96 70 - 99 mg/dL   BUN 5 (L) 6 - 20 mg/dL   Creatinine, Ser 9.38 0.44 - 1.00 mg/dL   Calcium  8.4 (L) 8.9 - 10.3 mg/dL   Total Protein 6.4 (L) 6.5 - 8.1 g/dL   Albumin 2.3 (L) 3.5 - 5.0 g/dL   AST 15 15 - 41 U/L   ALT 11 0 - 44 U/L   Alkaline  Phosphatase 208 (H) 38 - 126 U/L   Total Bilirubin 0.6 0.0 - 1.2 mg/dL   GFR, Estimated >39 >39 mL/min   Anion gap 9 5 - 15       Imaging:  DG Chest Port 1 View Result Date:  02/07/2023 CLINICAL DATA:  Chest congestion with headache and fever. EXAM: PORTABLE CHEST 1 VIEW COMPARISON:  March 19, 2021 FINDINGS: The heart size and mediastinal contours are within normal limits. Low lung volumes are noted. There is no evidence of an acute infiltrate, pleural effusion or pneumothorax. The visualized skeletal structures are unremarkable. IMPRESSION: No active disease. Electronically Signed   By: Suzen Dials M.D.   On: 02/07/2023 00:34     MAU Course/MDM: I have reviewed the triage vital signs and the nursing notes.   Pertinent labs & imaging results that were available during my care of the patient were reviewed by me and considered in my medical decision making (see chart for details).      I have reviewed her medical records including past results, notes and treatments.   I have ordered labs and reviewed results. CBC showed some leukocytosis which can be normal at term if nearing labor.  Chemistries normal    Covid test is positive NST reviewed  Treatments in MAU included EFM Patient appears well and pulmonary exam is normal   She is not dyspneic.  Fever is improved and she does not appear severely ill   Since she has been sick since last week off and on, will not prescribe Paxlovid.  Discussed normal supportive virus URI care.    Assessment: Single IUP at [redacted]w[redacted]d Covid infection Chest congestion with clear chest xray Fever/aches  Plan: Discharge home Supportive care for Covid  Rx Mucinex  for congestion Tylenol  prn fever/aches Labor precautions and fetal kick counts Follow up in Office for prenatal visits and recheck Encouraged to return if she develops worsening of symptoms, increase in pain, fever, or other concerning symptoms.   Pt stable at time of discharge.  Earnie Pouch CNM, MSN Certified Nurse-Midwife 02/07/2023 12:23 AM

## 2023-02-21 ENCOUNTER — Inpatient Hospital Stay (HOSPITAL_COMMUNITY): Payer: MEDICAID | Admitting: Anesthesiology

## 2023-02-21 ENCOUNTER — Inpatient Hospital Stay (HOSPITAL_COMMUNITY)
Admission: AD | Admit: 2023-02-21 | Discharge: 2023-02-22 | DRG: 807 | Disposition: A | Discharge status: Home / Self Care | Payer: MEDICAID | Attending: Obstetrics and Gynecology | Admitting: Obstetrics and Gynecology

## 2023-02-21 ENCOUNTER — Other Ambulatory Visit: Payer: Self-pay

## 2023-02-21 ENCOUNTER — Encounter (HOSPITAL_COMMUNITY): Payer: Self-pay | Admitting: Obstetrics & Gynecology

## 2023-02-21 DIAGNOSIS — D509 Iron deficiency anemia, unspecified: Secondary | ICD-10-CM | POA: Diagnosis present

## 2023-02-21 DIAGNOSIS — Z148 Genetic carrier of other disease: Secondary | ICD-10-CM | POA: Diagnosis not present

## 2023-02-21 DIAGNOSIS — O9902 Anemia complicating childbirth: Secondary | ICD-10-CM | POA: Diagnosis present

## 2023-02-21 DIAGNOSIS — Z3A39 39 weeks gestation of pregnancy: Secondary | ICD-10-CM

## 2023-02-21 DIAGNOSIS — O99824 Streptococcus B carrier state complicating childbirth: Principal | ICD-10-CM | POA: Diagnosis present

## 2023-02-21 DIAGNOSIS — O99820 Streptococcus B carrier state complicating pregnancy: Secondary | ICD-10-CM

## 2023-02-21 DIAGNOSIS — O26893 Other specified pregnancy related conditions, third trimester: Secondary | ICD-10-CM | POA: Diagnosis present

## 2023-02-21 DIAGNOSIS — O99019 Anemia complicating pregnancy, unspecified trimester: Secondary | ICD-10-CM | POA: Diagnosis present

## 2023-02-21 LAB — CBC
HCT: 30.3 % — ABNORMAL LOW (ref 36.0–46.0)
Hemoglobin: 9.7 g/dL — ABNORMAL LOW (ref 12.0–15.0)
MCH: 22.9 pg — ABNORMAL LOW (ref 26.0–34.0)
MCHC: 32 g/dL (ref 30.0–36.0)
MCV: 71.6 fL — ABNORMAL LOW (ref 80.0–100.0)
Platelets: 493 10*3/uL — ABNORMAL HIGH (ref 150–400)
RBC: 4.23 MIL/uL (ref 3.87–5.11)
RDW: 16.6 % — ABNORMAL HIGH (ref 11.5–15.5)
WBC: 18.3 10*3/uL — ABNORMAL HIGH (ref 4.0–10.5)
nRBC: 0 % (ref 0.0–0.2)

## 2023-02-21 LAB — TYPE AND SCREEN
ABO/RH(D): O POS
Antibody Screen: NEGATIVE

## 2023-02-21 LAB — RPR: RPR Ser Ql: NONREACTIVE

## 2023-02-21 MED ORDER — WITCH HAZEL-GLYCERIN EX PADS: 1.0000 | MEDICATED_PAD | CUTANEOUS | Status: DC | PRN

## 2023-02-21 MED ORDER — LACTATED RINGERS IV SOLN: 500.0000 mL | INTRAVENOUS | Status: DC | PRN

## 2023-02-21 MED ORDER — COCONUT OIL OIL: 1.0000 | TOPICAL_OIL | Status: DC | PRN

## 2023-02-21 MED ORDER — SOD CITRATE-CITRIC ACID 500-334 MG/5ML PO SOLN: 30.0000 mL | ORAL | Status: DC | PRN

## 2023-02-21 MED ORDER — ONDANSETRON HCL 4 MG/2ML IJ SOLN: 4.0000 mg | INTRAMUSCULAR | Status: DC | PRN

## 2023-02-21 MED ORDER — LACTATED RINGERS IV SOLN: 500.0000 mL | Freq: Once | INTRAVENOUS | Status: DC

## 2023-02-21 MED ORDER — LIDOCAINE HCL (PF) 1 % IJ SOLN: 30.0000 mL | INTRAMUSCULAR | Status: DC | PRN

## 2023-02-21 MED ORDER — PHENYLEPHRINE 80 MCG/ML (10ML) SYRINGE FOR IV PUSH (FOR BLOOD PRESSURE SUPPORT): 80.0000 ug | PREFILLED_SYRINGE | INTRAVENOUS | Status: DC | PRN

## 2023-02-21 MED ORDER — LACTATED RINGERS IV SOLN: INTRAVENOUS | Status: DC

## 2023-02-21 MED ORDER — OXYCODONE HCL 5 MG PO TABS: 10.0000 mg | ORAL_TABLET | ORAL | Status: DC | PRN

## 2023-02-21 MED ORDER — OXYCODONE-ACETAMINOPHEN 5-325 MG PO TABS: 1.0000 | ORAL_TABLET | ORAL | Status: DC | PRN

## 2023-02-21 MED ORDER — LIDOCAINE HCL (PF) 1 % IJ SOLN
INTRAMUSCULAR | Status: DC | PRN
  Administered 2023-02-21: 5 mL via EPIDURAL
  Administered 2023-02-21: 2 mL via EPIDURAL
  Administered 2023-02-21: 3 mL via EPIDURAL

## 2023-02-21 MED ORDER — DIBUCAINE (PERIANAL) 1 % EX OINT: 1.0000 | TOPICAL_OINTMENT | CUTANEOUS | Status: DC | PRN

## 2023-02-21 MED ORDER — ACETAMINOPHEN 325 MG PO TABS
650.0000 mg | ORAL_TABLET | ORAL | Status: DC | PRN
  Administered 2023-02-21: 650 mg via ORAL
  Filled 2023-02-21: qty 2

## 2023-02-21 MED ORDER — EPHEDRINE 5 MG/ML INJ: 10.0000 mg | INTRAVENOUS | Status: DC | PRN

## 2023-02-21 MED ORDER — OXYTOCIN BOLUS FROM INFUSION
333.0000 mL | Freq: Once | INTRAVENOUS | Status: AC
  Administered 2023-02-21: 333 mL via INTRAVENOUS

## 2023-02-21 MED ORDER — FERROUS SULFATE 325 (65 FE) MG PO TABS
325.0000 mg | ORAL_TABLET | Freq: Every day | ORAL | Status: DC
  Administered 2023-02-22: 325 mg via ORAL
  Filled 2023-02-21: qty 1

## 2023-02-21 MED ORDER — MAGNESIUM HYDROXIDE 400 MG/5ML PO SUSP: 30.0000 mL | ORAL | Status: DC | PRN

## 2023-02-21 MED ORDER — OXYTOCIN-SODIUM CHLORIDE 30-0.9 UT/500ML-% IV SOLN
2.5000 [IU]/h | INTRAVENOUS | Status: DC
  Filled 2023-02-21: qty 500

## 2023-02-21 MED ORDER — MEASLES, MUMPS & RUBELLA VAC IJ SOLR: 0.5000 mL | Freq: Once | INTRAMUSCULAR | Status: DC

## 2023-02-21 MED ORDER — SODIUM CHLORIDE 0.9 % IV SOLN: 1.0000 g | INTRAVENOUS | Status: DC

## 2023-02-21 MED ORDER — ACETAMINOPHEN 325 MG PO TABS: 650.0000 mg | ORAL_TABLET | ORAL | Status: DC | PRN

## 2023-02-21 MED ORDER — ONDANSETRON HCL 4 MG PO TABS: 4.0000 mg | ORAL_TABLET | ORAL | Status: DC | PRN

## 2023-02-21 MED ORDER — FENTANYL-BUPIVACAINE-NACL 0.5-0.125-0.9 MG/250ML-% EP SOLN
12.0000 mL/h | EPIDURAL | Status: DC | PRN
  Administered 2023-02-21: 12 mL/h via EPIDURAL
  Filled 2023-02-21: qty 250

## 2023-02-21 MED ORDER — IBUPROFEN 600 MG PO TABS
600.0000 mg | ORAL_TABLET | Freq: Four times a day (QID) | ORAL | Status: DC
  Administered 2023-02-21 – 2023-02-22 (×5): 600 mg via ORAL
  Filled 2023-02-21 (×4): qty 1

## 2023-02-21 MED ORDER — OXYCODONE HCL 5 MG PO TABS: 5.0000 mg | ORAL_TABLET | ORAL | Status: DC | PRN

## 2023-02-21 MED ORDER — ONDANSETRON HCL 4 MG/2ML IJ SOLN: 4.0000 mg | Freq: Four times a day (QID) | INTRAMUSCULAR | Status: DC | PRN

## 2023-02-21 MED ORDER — SODIUM CHLORIDE 0.9 % IV SOLN
2.0000 g | Freq: Once | INTRAVENOUS | Status: AC
  Administered 2023-02-21: 2 g via INTRAVENOUS
  Filled 2023-02-21: qty 2000

## 2023-02-21 MED ORDER — OXYCODONE-ACETAMINOPHEN 5-325 MG PO TABS: 2.0000 | ORAL_TABLET | ORAL | Status: DC | PRN

## 2023-02-21 MED ORDER — SIMETHICONE 80 MG PO CHEW: 80.0000 mg | CHEWABLE_TABLET | ORAL | Status: DC | PRN

## 2023-02-21 MED ORDER — PRENATAL MULTIVITAMIN CH
1.0000 | ORAL_TABLET | Freq: Every day | ORAL | Status: DC
  Administered 2023-02-21 – 2023-02-22 (×2): 1 via ORAL
  Filled 2023-02-21 (×2): qty 1

## 2023-02-21 MED ORDER — DIPHENHYDRAMINE HCL 25 MG PO CAPS: 25.0000 mg | ORAL_CAPSULE | Freq: Four times a day (QID) | ORAL | Status: DC | PRN

## 2023-02-21 MED ORDER — DIPHENHYDRAMINE HCL 50 MG/ML IJ SOLN: 12.5000 mg | INTRAMUSCULAR | Status: DC | PRN

## 2023-02-21 MED ORDER — BENZOCAINE-MENTHOL 20-0.5 % EX AERO: 1.0000 | INHALATION_SPRAY | CUTANEOUS | Status: DC | PRN

## 2023-02-21 MED ORDER — FLEET ENEMA RE ENEM: 1.0000 | ENEMA | RECTAL | Status: DC | PRN

## 2023-02-21 MED ORDER — TETANUS-DIPHTH-ACELL PERTUSSIS 5-2.5-18.5 LF-MCG/0.5 IM SUSY: 0.5000 mL | PREFILLED_SYRINGE | Freq: Once | INTRAMUSCULAR | Status: DC

## 2023-02-21 NOTE — MAU Note (Signed)
.  Tammy Cherry is a 28 y.o. at [redacted]w[redacted]d here in MAU reporting: for the past two hours pt reports ctxs lower abdomen   5 min apart.  No lof, no bleeding +FM  Health department, GBS pos.   Pain score: 10/10 ctxs  Vitals:   02/21/23 0650  BP: 132/85  Pulse: 93  Resp: 17  SpO2: 96%    FHT: 144  Lab orders placed from triage: labor eval.

## 2023-02-21 NOTE — H&P (Addendum)
OBSTETRIC ADMISSION HISTORY AND PHYSICAL  Tammy Cherry is a 28 y.o. female [phone] with IUP at [redacted]w[redacted]d by LMP presenting for SOL. She reports +FMs, No LOF, no VB, no blurry vision, no headaches or peripheral edema, and no RUQ pain.  She plans on breastfeeding and bottle feeding. She is considering Nexplanon for birth control but had questions about cost of BTL. She received her prenatal care at [facility]   Dating: By LMP --->  Estimated Date of Delivery: 02/26/23 Sono:   @[redacted]w[redacted]d , CWD, normal anatomy, cephalic presentation, posterior placenta, AFI 10.5 cm, 34% EFW  Prenatal History/Complications: IDA, UTI during pregnancy, alpha thal silent carrier  Past Medical History: Past Medical History:  Diagnosis Date   Anemia    Gall stones    Past Surgical History: Past Surgical History:  Procedure Laterality Date   NO PAST SURGERIES     Obstetrical History: OB History     Gravida  4   Para  3   Term  3   Preterm      AB      Living  3      SAB      IAB      Ectopic      Multiple  0   Live Births  3          Social History Social History   Socioeconomic History   Marital status: Significant Other    Spouse name: Not on file   Number of children: Not on file   Years of education: Not on file   Highest education level: Not on file  Occupational History   Not on file  Tobacco Use   Smoking status: Never   Smokeless tobacco: Never  Vaping Use   Vaping status: Never Used  Substance and Sexual Activity   Alcohol use: Never   Drug use: Never   Sexual activity: Yes    Birth control/protection: None  Other Topics Concern   Not on file  Social History Narrative   Not on file   Social Drivers of Health   Financial Resource Strain: Not on file  Food Insecurity: No Food Insecurity (10/13/2022)   Hunger Vital Sign    Worried About Running Out of Food in the Last Year: Never true    Ran Out of Food in the Last Year: Never true  Transportation  Needs: No Transportation Needs (10/13/2022)   PRAPARE - Administrator, Civil Service (Medical): No    Lack of Transportation (Non-Medical): No  Physical Activity: Not on file  Stress: Not on file  Social Connections: Not on file   Family History: History reviewed. No pertinent family history.  Allergies: No Known Allergies  Medications Prior to Admission  Medication Sig Dispense Refill Last Dose/Taking   acetaminophen (TYLENOL) 500 MG tablet Take 2 tablets (1,000 mg total) by mouth every 8 (eight) hours as needed (pain). 60 tablet 0 02/20/2023   ferrous sulfate 325 (65 FE) MG tablet Take 1 tablet (325 mg total) by mouth every other day. 30 tablet 3 02/20/2023   Prenatal Vit-Fe Fumarate-FA (MULTIVITAMIN-PRENATAL) 27-0.8 MG TABS tablet Take 1 tablet by mouth daily at 12 noon.   02/20/2023   guaiFENesin (MUCINEX) 600 MG 12 hr tablet Take 1 tablet (600 mg total) by mouth 2 (two) times daily as needed. 30 tablet 1    Review of Systems   All systems reviewed and negative except as stated in HPI  Blood pressure 122/82, pulse 96, temperature  98.2 F (36.8 C), temperature source Oral, resp. rate 17, weight 70.6 kg, SpO2 96%, not currently breastfeeding. General appearance: alert, cooperative, and no distress Lungs: clear to auscultation anteriorly Heart: regular rate and rhythm Abdomen: soft, mild diffuse tenderness Extremities: no lower extremity edema, warmth, tenderness Presentation: unsure Fetal monitoring Baseline: 145 bpm, Variability: Good {> 6 bpm), Accelerations: Reactive, and Decelerations: Absent Uterine activity Frequency: irregular, periods of contractions every 2-3 minutes Dilation: 5 Effacement (%): 70 Station: -3 Exam by:: Felipa Furnace RN  Prenatal labs: ABO, Rh: --/--/PENDING (01/28 1914) Antibody: PENDING (01/28 0654) Rubella:   RPR:    HBsAg:    HIV: Non-reactive (12/09 0000)  GBS: Positive/-- (01/09 0000)    Lab Results  Component Value Date   GBS  Positive 02/02/2023   GTT wnl Genetic screening alpha thal silent carrier, no partner testing Anatomy US normal  Immunization History  Administered Date(s) Administered   MMR 07/08/2021   Prenatal Transfer Tool  Maternal Diabetes: No Genetic Screening: Abnormal:  Results: Other: alpha thal silent carrier, partner status unknown Maternal Ultrasounds/Referrals: Normal Fetal Ultrasounds or other Referrals:  none Maternal Substance Abuse:  No Significant Maternal Medications:  None Significant Maternal Lab Results: Group B Strep positive Number of Prenatal Visits:greater than 3 verified prenatal visits Maternal Vaccinations:TDap  Results for orders placed or performed during the hospital encounter of 02/21/23 (from the past 24 hours)  CBC   Collection Time: 02/21/23  6:54 AM  Result Value Ref Range   WBC 18.3 (H) 4.0 - 10.5 K/uL   RBC 4.23 3.87 - 5.11 MIL/uL   Hemoglobin 9.7 (L) 12.0 - 15.0 g/dL   HCT 78.2 (L) 95.6 - 21.3 %   MCV 71.6 (L) 80.0 - 100.0 fL   MCH 22.9 (L) 26.0 - 34.0 pg   MCHC 32.0 30.0 - 36.0 g/dL   RDW 08.6 (H) 57.8 - 46.9 %   Platelets 493 (H) 150 - 400 K/uL   nRBC 0.0 0.0 - 0.2 %  Type and screen [facility]   Collection Time: 02/21/23  6:54 AM  Result Value Ref Range   ABO/RH(D) PENDING    Antibody Screen PENDING    Sample Expiration      02/24/2023,2359 Performed at [facility], [address], [place], [place] [address]     Patient Active Problem List   Diagnosis Date Noted   Normal labor 02/21/2023   Vaginal delivery 07/06/2021   Pyelonephritis affecting pregnancy 06/13/2021   Language barrier 06/13/2021   Glucose tolerance test abnormal 04/26/2021   Supervision of normal pregnancy 03/08/2021   Rubella non-immune status, antepartum 03/04/2021   Asymptomatic bacteriuria during pregnancy 03/04/2021   Assessment/Plan:  Tammy Cherry is a 28 y.o. G4P3003 at [redacted]w[redacted]d here for SOL  #Labor:  5/70%/-3 #Pain: Tolerating well, patient requested epidural  #FWB: Cat 1 #GBS status: positive, plan for intrapartum antibiotic prophylaxis #Feeding: Breastmilk  and Formula #Reproductive Life planning: Nexplanon and wanted information about BTL #Circ: boy yes  #IDA: s/p oral iron supplements, Hgb 9.7 on admission #UTI during pregnancy: s/p Macrobid #Alpha thal silent carrier: no partner testing  Estrella Deeds, Medical Student  02/21/2023, 8:09 AM  I was present for the exam and agree with above.  Katrinka Blazing, [place], CNM 02/21/2023 1:46 PM   Attestation of Attending Supervision of Provider:  Evaluation and management procedures were performed by this provider under my supervision and collaboration. I have reviewed the provider's note and chart, and I agree with the management  and plan.   Shonna Chock, MD Faculty Practice, [facility]

## 2023-02-21 NOTE — Discharge Summary (Shared)
Postpartum Discharge Summary  Date of Service updated***     Patient Name: Tammy Cherry DOB: 1995-12-20 MRN: [id number]  Date of admission: 02/21/2023 Delivery date:02/21/2023 Delivering provider: Elige Ko Date of discharge: 02/21/2023  Admitting diagnosis: Normal labor [O80, Z37.9] Intrauterine pregnancy: [redacted]w[redacted]d     Secondary diagnosis:  Principal Problem:   Vaginal delivery Active Problems:   Normal labor   Iron deficiency anemia during pregnancy  Additional problems: ***    Discharge diagnosis: Term Pregnancy Delivered and Anemia                                              Post partum procedures:{Postpartum procedures:23558} Augmentation: None Complications: None  Hospital course: Onset of Labor With Vaginal Delivery      28 y.o. yo [phone] at [redacted]w[redacted]d was admitted in Active Labor on 02/21/2023. Labor course was complicated by nothing  Membrane Rupture Time/Date: 10:08 AM,02/21/2023  Delivery Method:Vaginal, Spontaneous Operative Delivery:N/A Episiotomy: None Lacerations:  None Patient had a postpartum course complicated by ***.  She is ambulating, tolerating a regular diet, passing flatus, and urinating well. Patient is discharged home in stable condition on 02/21/23.  Newborn Data: Birth date:02/21/2023 Birth time:10:11 AM Gender:Female Living status:Living Apgars:9 ,9  Weight:   Magnesium Sulfate received: No BMZ received: No Rhophylac:N/A MMR:N/A T-DaP:Given prenatally Flu: Yes RSV Vaccine received: No Transfusion:{Transfusion received:30440034}  Immunizations received: Immunization History  Administered Date(s) Administered   MMR 07/08/2021    Physical exam  Vitals:   02/21/23 0931 02/21/23 1017 02/21/23 1032 02/21/23 1047  BP: 124/79 129/88 120/73 123/76  Pulse: 78 83 87 80  Resp: 16  16   Temp:      TempSrc:      SpO2:      Weight:      Height:       General: {Exam; general:21111117} Lochia: {Desc;  appropriate/inappropriate:30686::"appropriate"} Uterine Fundus: {Desc; firm/soft:30687} Incision: {Exam; incision:21111123} DVT Evaluation: {Exam; dvt:2111122} Labs: Lab Results  Component Value Date   WBC 18.3 (H) 02/21/2023   HGB 9.7 (L) 02/21/2023   HCT 30.3 (L) 02/21/2023   MCV 71.6 (L) 02/21/2023   PLT 493 (H) 02/21/2023      Latest Ref Rng & Units 02/07/2023   12:52 AM  CMP  Glucose 70 - 99 mg/dL 96   BUN 6 - 20 mg/dL 5   Creatinine 1.47 - 8.29 mg/dL 5.62   Sodium 130 - 865 mmol/L 134   Potassium 3.5 - 5.1 mmol/L 3.6   Chloride 98 - 111 mmol/L 107   CO2 22 - 32 mmol/L 18   Calcium 8.9 - 10.3 mg/dL 8.4   Total Protein 6.5 - 8.1 g/dL 6.4   Total Bilirubin 0.0 - 1.2 mg/dL 0.6   Alkaline Phos 38 - 126 U/L 208   AST 15 - 41 U/L 15   ALT 0 - 44 U/L 11    Edinburgh Score:    07/14/2021    2:51 PM  Edinburgh Postnatal Depression Scale Screening Tool  I have been able to laugh and see the funny side of things. 0  I have looked forward with enjoyment to things. 0  I have blamed myself unnecessarily when things went wrong. 0  I have been anxious or worried for no good reason. 0  I have felt scared or panicky for no good reason. 0  Things  have been getting on top of me. 0  I have been so unhappy that I have had difficulty sleeping. 0  I have felt sad or miserable. 0  I have been so unhappy that I have been crying. 0  The thought of harming myself has occurred to me. 0  Edinburgh Postnatal Depression Scale Total 0   No data recorded  After visit meds:  Allergies as of 02/21/2023   No Known Allergies   Med Rec must be completed prior to using this [facility]***        Discharge home in stable condition Infant Feeding: Breast Infant Disposition:{CHL IP OB HOME WITH IONGEX:52841} Discharge instruction: per After Visit Summary and Postpartum booklet. Activity: Advance as tolerated. Pelvic rest for 6 weeks.  Diet: {OB LKGM:01027253} Future Appointments: Future  Appointments  Date Time Provider Department Center  04/13/2023  3:10 PM [facility] [place] [facility]   Follow up Visit:   Please schedule this patient for a In person postpartum visit in 6 weeks with the following provider: Any provider. Additional Postpartum F/U: none   Low risk pregnancy complicated by:  none Delivery mode:  Vaginal, Spontaneous Anticipated Birth Control:  Nexplanon vs interval BTL   02/21/2023 Dorathy Kinsman, CNM

## 2023-02-21 NOTE — Progress Notes (Signed)
Ordered pt lunch and snack, assisted RN with admission information to the room, by Orlan Leavens Spanish Medical Interpreter.

## 2023-02-21 NOTE — Anesthesia Postprocedure Evaluation (Signed)
Anesthesia Post Note  Patient: Tammy Cherry  Procedure(s) Performed: AN AD HOC LABOR EPIDURAL     Patient location during evaluation: Mother Baby Anesthesia Type: Epidural Level of consciousness: awake, awake and alert and oriented Pain management: satisfactory to patient Vital Signs Assessment: post-procedure vital signs reviewed and stable Respiratory status: spontaneous breathing, nonlabored ventilation and respiratory function stable Cardiovascular status: blood pressure returned to baseline and stable Postop Assessment: no headache, no backache, able to ambulate, adequate PO intake and patient able to bend at knees Anesthetic complications: no   No notable events documented.  Last Vitals:  Vitals:   02/21/23 1300 02/21/23 1700  BP: 130/81 131/78  Pulse: 88 83  Resp: 18 18  Temp: 37.2 C 36.8 C  SpO2: 100% 99%    Last Pain:  Vitals:   02/21/23 1800  TempSrc:   PainSc: 0-No pain   Pain Goal:                   [name]

## 2023-02-21 NOTE — Progress Notes (Signed)
Assisted Marcene Duos, MD with interpretation during the epidural procedure, by Orlan Leavens Spanish Medical Interpreter.

## 2023-02-21 NOTE — Anesthesia Preprocedure Evaluation (Signed)
Anesthesia Evaluation  Patient identified by MRN, date of birth, ID band Patient awake    Reviewed: Allergy & Precautions, Patient's Chart, lab work & pertinent test results  Airway Mallampati: II  TM Distance: >3 FB Neck ROM: Full    Dental  (+) Dental Advisory Given   Pulmonary neg pulmonary ROS   breath sounds clear to auscultation       Cardiovascular negative cardio ROS  Rhythm:Regular Rate:Normal     Neuro/Psych negative neurological ROS     GI/Hepatic negative GI ROS, Neg liver ROS,,,  Endo/Other  negative endocrine ROS    Renal/GU negative Renal ROS     Musculoskeletal   Abdominal   Peds  Hematology  (+) Blood dyscrasia, anemia   Anesthesia Other Findings   Reproductive/Obstetrics (+) Pregnancy                             Anesthesia Physical Anesthesia Plan  ASA: 2  Anesthesia Plan: Epidural   Post-op Pain Management:    Induction:   PONV Risk Score and Plan: 2  Airway Management Planned: Natural Airway  Additional Equipment:   Intra-op Plan:   Post-operative Plan:   Informed Consent: I have reviewed the patients History and Physical, chart, labs and discussed the procedure including the risks, benefits and alternatives for the proposed anesthesia with the patient or authorized representative who has indicated his/her understanding and acceptance.       Plan Discussed with:   Anesthesia Plan Comments:        Anesthesia Quick Evaluation
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
## 2023-02-21 NOTE — Anesthesia Procedure Notes (Signed)
Epidural Patient location during procedure: OB Start time: 02/21/2023 8:25 AM End time: 02/21/2023 8:32 AM  Staffing Anesthesiologist: Marcene Duos, MD Performed: anesthesiologist   Preanesthetic Checklist Completed: patient identified, IV checked, site marked, risks and benefits discussed, surgical consent, monitors and equipment checked, pre-op evaluation and timeout performed  Epidural Patient position: sitting Prep: DuraPrep and site prepped and draped Patient monitoring: continuous pulse ox and blood pressure Approach: midline Location: L4-L5 Injection technique: LOR air  Needle:  Needle type: Tuohy  Needle gauge: 17 G Needle length: 9 cm and 9 Needle insertion depth: 5 cm cm Catheter type: closed end flexible Catheter size: 19 Gauge Catheter at skin depth: 10 cm Test dose: negative  Assessment Events: blood not aspirated, no cerebrospinal fluid, injection not painful, no injection resistance, no paresthesia and negative IV test

## 2023-02-22 ENCOUNTER — Other Ambulatory Visit (HOSPITAL_COMMUNITY): Payer: Self-pay

## 2023-02-22 MED ORDER — SENNOSIDES-DOCUSATE SODIUM 8.6-50 MG PO TABS
1.0000 | ORAL_TABLET | Freq: Every day | ORAL | 0 refills | Status: AC
  Filled 2023-02-22: qty 30, 30d supply, fill #0

## 2023-02-22 MED ORDER — IBUPROFEN 600 MG PO TABS
600.0000 mg | ORAL_TABLET | Freq: Four times a day (QID) | ORAL | 0 refills | Status: AC
  Filled 2023-02-22: qty 30, 8d supply, fill #0

## 2023-02-22 NOTE — Discharge Instructions (Signed)
Outpatient tubal ligation   Estimated out-of-pocket total for patient (does NOT include anesthesia)  Procedure fee + Facility fee  Salpingectomy: $855 + $5835.90 = $6690.90 [place]: $497.80 + $5835.90 = $6333.70 [place]: $497.80 + $5835.90 = $0102.72  Patient needs to inform staff of desire for tubal ligation Pre-payment of procedure fee is required for procedure to be scheduled  Payment plans are available upon request  Need to apply for Financial Aid to reduce your cost, ask [place] how!   * Patient will need to call [facility] (Anesthesia) at [phone] for estimate   Ligadura de trompas para pacientes ambulatorios Costo total aproximado que le toca a la paciente (NO incluye la anestesia) Costo del procedimiento ms costo del centro Salpingectoma: $855 + $5835.90 = $4259.56 [place]: $497.80 + $5835.90 = $6333.70 [place] (clips) [place]: $497.80 + $5835.90 = $6333.70 La paciente debe informarle al personal de su deseo de hacerse la ligadura de trompas. Se requiere el pago por adelantado del costo del procedimiento para que este se Sports coach. Los planes de pago estn disponibles por solicitud. *La paciente debe llamar a [facility] Janann August) al [phone] para pedir la cotizacin.
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
## 2023-02-22 NOTE — Progress Notes (Signed)
 Created in error

## 2023-02-22 NOTE — Progress Notes (Signed)
POSTPARTUM PROGRESS NOTE  Post Partum Day 1  Subjective:  Tammy Cherry is a 28 y.o. [id number] s/p SVD at [redacted]w[redacted]d.  She reports she is doing well. No acute events overnight. She denies any problems with ambulating, voiding or po intake. Denies nausea or vomiting.  Pain is well controlled.  Lochia is less than a period.  Objective: Blood pressure 114/81, pulse 75, temperature 98.2 F (36.8 C), temperature source Oral, resp. rate 18, height 5\' 2"  (1.575 m), weight 73.6 kg, SpO2 100%, unknown if currently breastfeeding.  Physical Exam:  General: alert, cooperative and no distress Chest: no respiratory distress Heart:regular rate, distal pulses intact Uterine Fundus: firm, appropriately tender DVT Evaluation: No calf swelling or tenderness Extremities: No edema Skin: warm, dry  Recent Labs    02/21/23 0654  HGB 9.7*  HCT 30.3*    Assessment/Plan: Grazia Taffe is a 28 y.o. N5A2130 s/p SVD at [redacted]w[redacted]d   PPD#1 - Doing well  Routine postpartum care  Contraception: Nexplanon outpatient vs BTL Feeding: Breast Dispo: Plan for discharge tomorrow.   LOS: 1 day   Sundra Aland, MD OB Fellow  02/22/2023, 8:37 AM

## 2023-03-01 ENCOUNTER — Telehealth (HOSPITAL_COMMUNITY): Payer: Self-pay | Admitting: *Deleted

## 2023-03-01 NOTE — Telephone Encounter (Signed)
 03/01/2023  Name: Tammy Cherry MRN: [id number] DOB: October 21, 1995  Reason for Call:  Transition of Care Hospital Discharge Call  Contact Status: Patient Contact Status: Complete  Language assistant needed: Interpreter Mode: Telephonic Interpreter Interpreter Name: Lazaro [phone]        Follow-Up Questions: Do You Have Any Concerns About Your Health As You Heal From Delivery?: No Do You Have Any Concerns About Your Infants Health?: No  Edinburgh Postnatal Depression Scale:  In the Past 7 Days: I have been able to laugh and see the funny side of things.: As much as I always could I have looked forward with enjoyment to things.: As much as I ever did I have blamed myself unnecessarily when things went wrong.: No, never I have been anxious or worried for no good reason.: No, not at all I have felt scared or panicky for no good reason.: No, not at all Things have been getting on top of me.: No, I have been coping as well as ever I have been so unhappy that I have had difficulty sleeping.: Not at all I have felt sad or miserable.: No, not at all I have been so unhappy that I have been crying.: No, never The thought of harming myself has occurred to me.: Never Edinburgh Postnatal Depression Scale Total: 0  PHQ2-9 Depression Scale:     Discharge Follow-up: Edinburgh score requires follow up?: No  Post-discharge interventions: Reviewed Newborn Safe Sleep Practices  Mliss Sieve, RN 03/01/2023 15:19

## 2023-04-13 ENCOUNTER — Inpatient Hospital Stay: Admission: RE | Admit: 2023-04-13 | Payer: PRIVATE HEALTH INSURANCE | Source: Ambulatory Visit

## 2023-08-07 IMAGING — CT CT CHEST-ABD-PELV W/ CM
2 of 4 series · 15 of 36 positions shown, 17 images · IV contrast (agent unspecified)
Comparison: None.

CLINICAL DATA: Pregnant, fell in shower.

EXAM:
CT CHEST, ABDOMEN, AND PELVIS WITH CONTRAST
TECHNIQUE: Multidetector CT imaging of the chest, abdomen and pelvis was
performed following the standard protocol during bolus
administration of intravenous contrast.

[Series 1: cap with · axial · 0.88mm/px · z∈[+557,+1122]mm · 12 of 135 slices shown, 14 images]
[im 11/135  mediastinal]
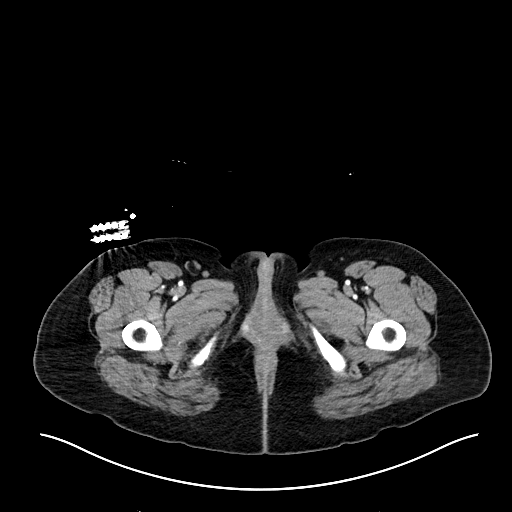
[im 11/135  bone]
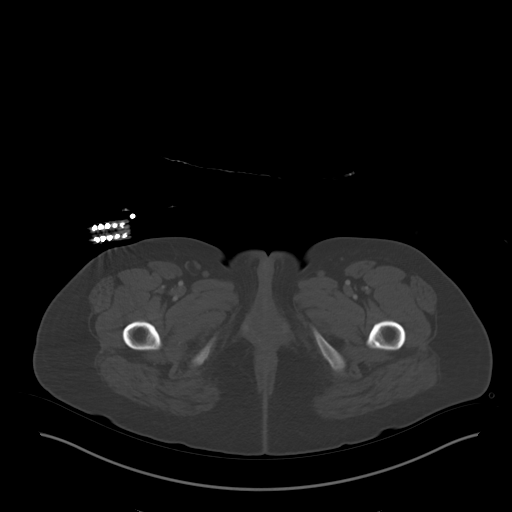
[im 21/135  mediastinal]
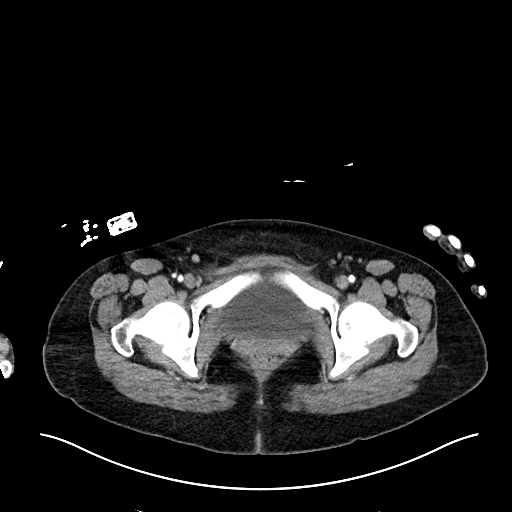
[im 31/135  mediastinal]
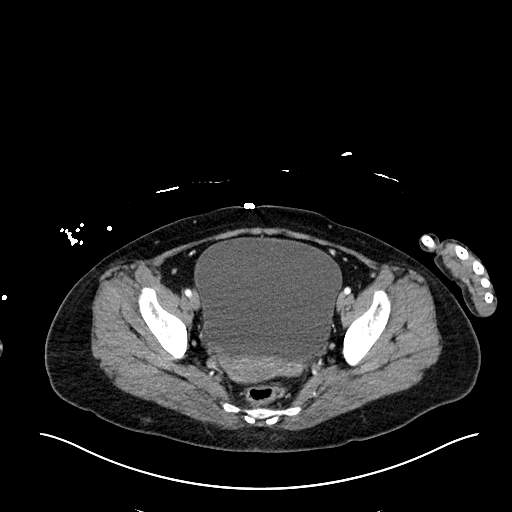
[im 42/135  mediastinal]
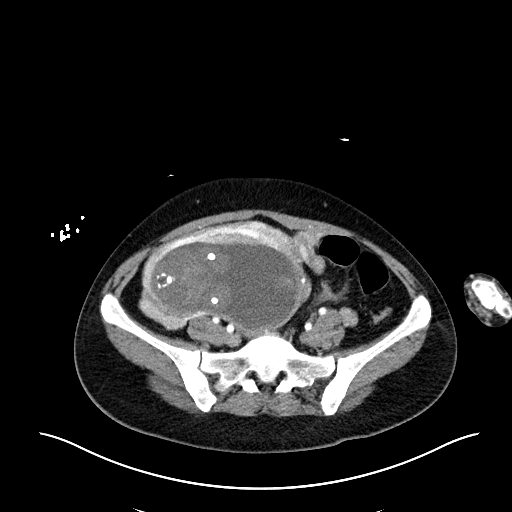
[im 52/135  mediastinal]
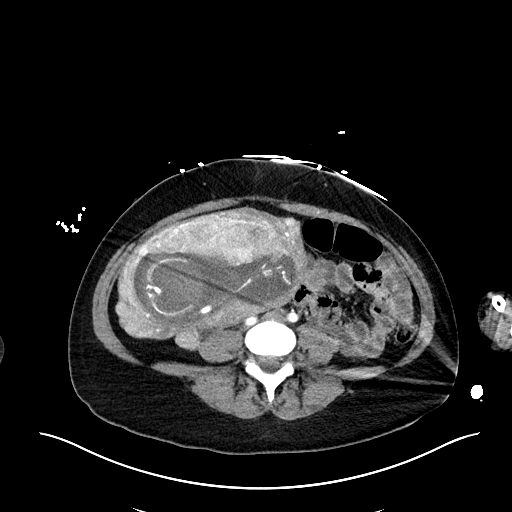
[im 62/135  mediastinal]
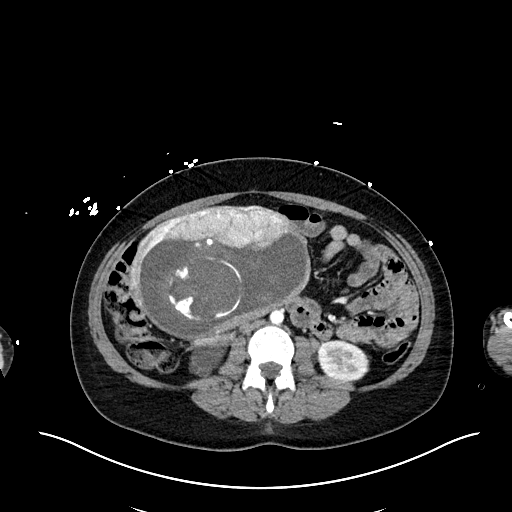
[im 73/135  mediastinal]
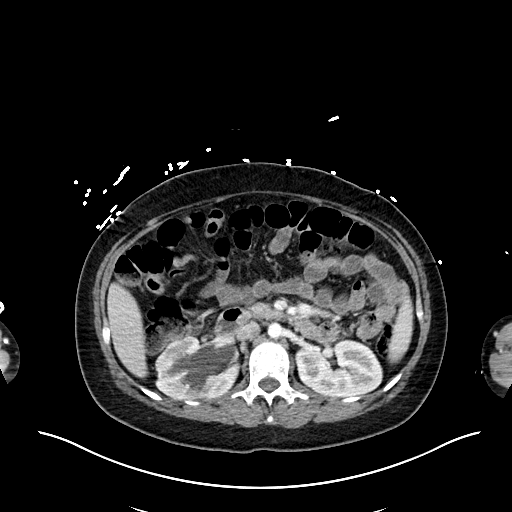
[im 83/135  mediastinal]
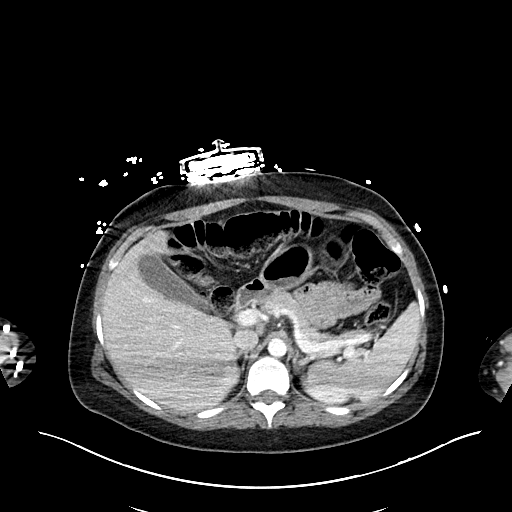
[im 93/135  mediastinal]
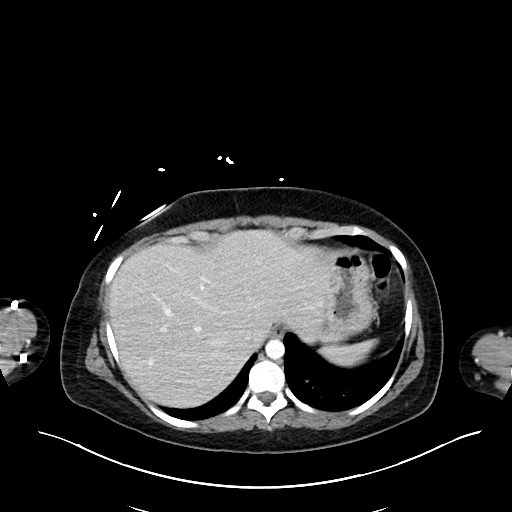
[im 93/135  bone]
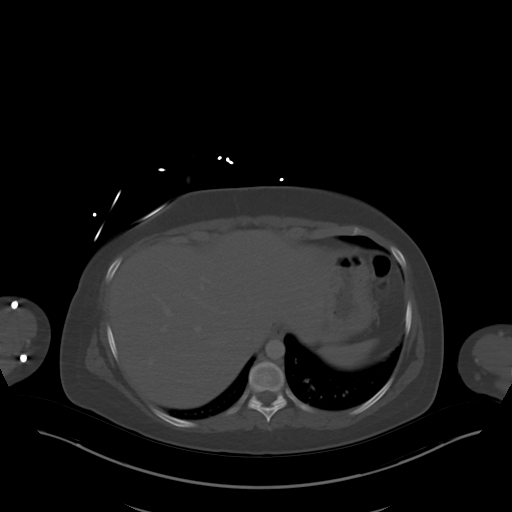
[im 104/135  mediastinal]
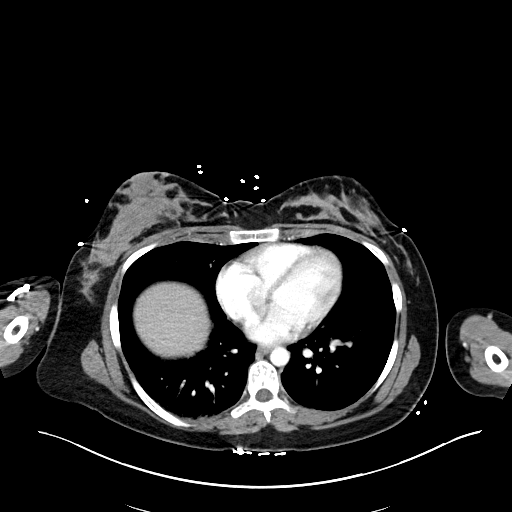
[im 114/135  mediastinal]
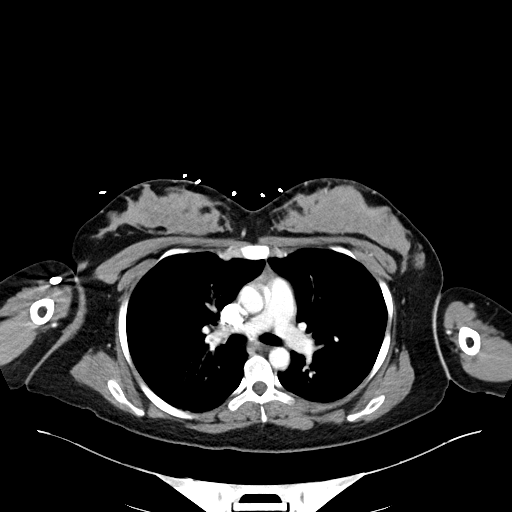
[im 124/135  mediastinal]
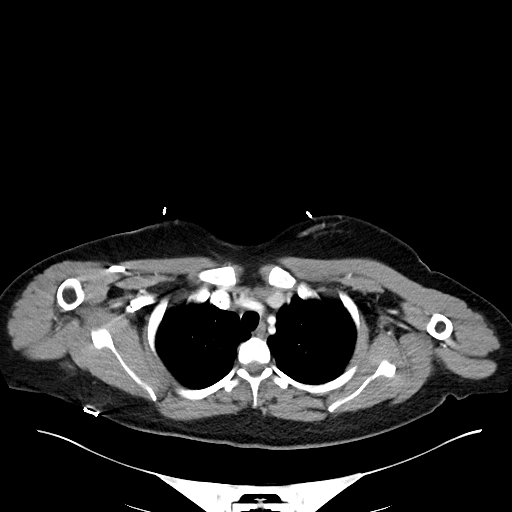

[Series 7: cor · coronal · 0.95mm/px · 3 of 100 slices shown]
[im 20/100  mediastinal]
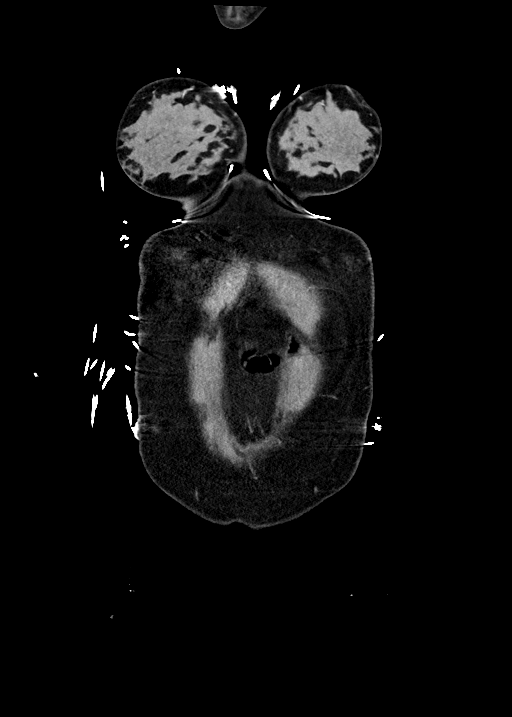
[im 40/100  mediastinal]
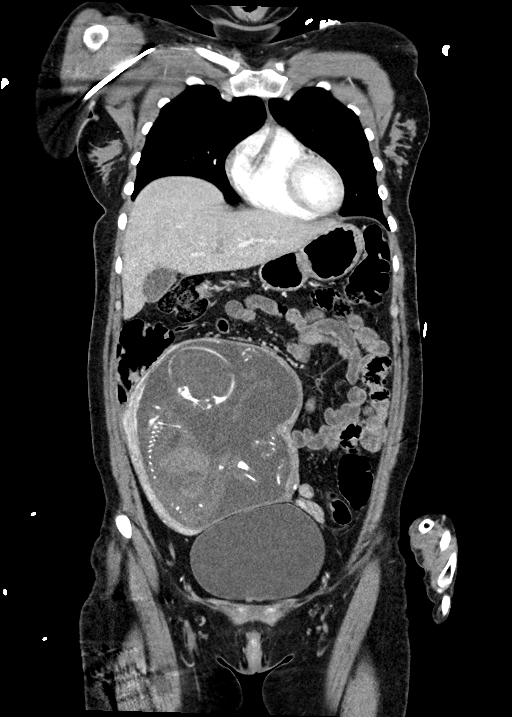
[im 60/100  mediastinal]
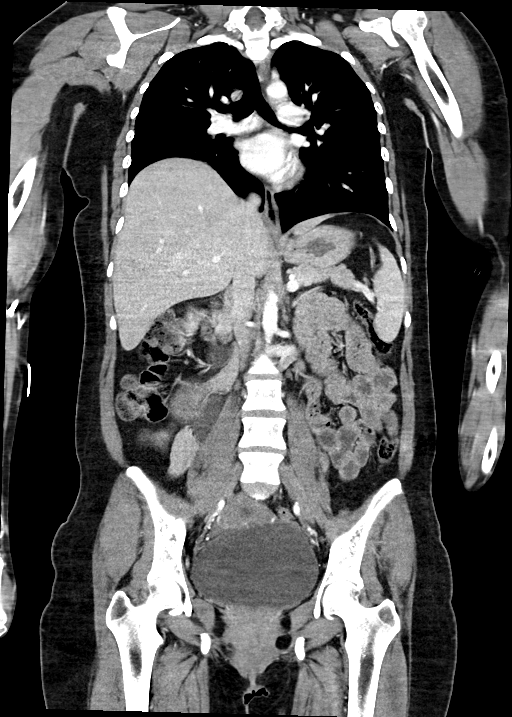

[15 of 36 positions shown; findings below may reference images not displayed]

RADIATION DOSE REDUCTION: This exam was performed according to the
departmental dose-optimization program which includes automated
exposure control, adjustment of the mA and/or kV according to
patient size and/or use of iterative reconstruction technique.

CONTRAST:  100mL OMNIPAQUE IOHEXOL 300 MG/ML  SOLN
FINDINGS: CT CHEST FINDINGS

Cardiovascular: No significant vascular findings. Normal heart size.
No pericardial effusion.

Mediastinum/Nodes: No enlarged mediastinal, hilar, or axillary lymph
nodes. Thyroid gland, trachea, and esophagus demonstrate no
significant findings.

Lungs/Pleura: Lungs are clear. No pleural effusion or pneumothorax.

Musculoskeletal: No chest wall mass or suspicious bone lesions
identified.

CT ABDOMEN PELVIS FINDINGS

Hepatobiliary: No focal liver abnormality is seen. No gallstones,
gallbladder wall thickening, or biliary dilatation.

Pancreas: Unremarkable. No pancreatic ductal dilatation or
surrounding inflammatory changes.

Spleen: Normal in size without focal abnormality.

Adrenals/Urinary Tract: Adrenal glands are unremarkable. Kidneys are
normal in size, without focal lesions. There is marked severity
right-sided hydronephrosis and proximal to mid right hydroureter.
Bladder is unremarkable.

Stomach/Bowel: Stomach is within normal limits. Appendix appears
normal. No evidence of bowel wall thickening, distention, or
inflammatory changes.

Vascular/Lymphatic: No significant vascular findings are present. No
enlarged abdominal or pelvic lymph nodes.

Reproductive: A single intrauterine pregnancy is seen, positioned
within the mid to lower right abdomen and right hemipelvis. There is
suspected mass effect on the mid to distal right ureter. The
placenta is anterior in position and is unremarkable.

Other: No abdominal wall hernia or abnormality. No abdominopelvic
ascites.

Musculoskeletal: No acute or significant osseous findings.
IMPRESSION: 1. Single intrauterine pregnancy, as described above. Correlation
with pelvic ultrasound is recommended to confirm post-traumatic
changes to the fetus, placenta and uterus, given the patient's
history of recent fall.
2. No evidence of traumatic injury within the chest, abdomen or
pelvis.
3. Marked severity right-sided hydronephrosis and hydroureter which
may be secondary to partial obstruction secondary to positioning of
the previously noted intrauterine pregnancy.

## 2023-11-01 IMAGING — US US RENAL
1 series · 15 of 25 positions shown · non-contrast
Comparison: No prior ultrasound, correlation is made with
03/19/2021 CT chest abdomen pelvis

CLINICAL DATA: Left-sided flank pain

EXAM:
RENAL / URINARY TRACT ULTRASOUND COMPLETE

[Series 1: us renal · 15 of 35 slices shown]
[im 1/35]
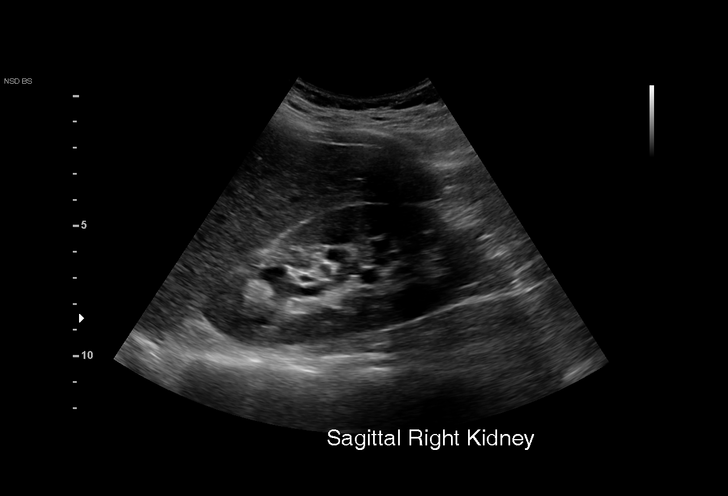
[im 3/35]
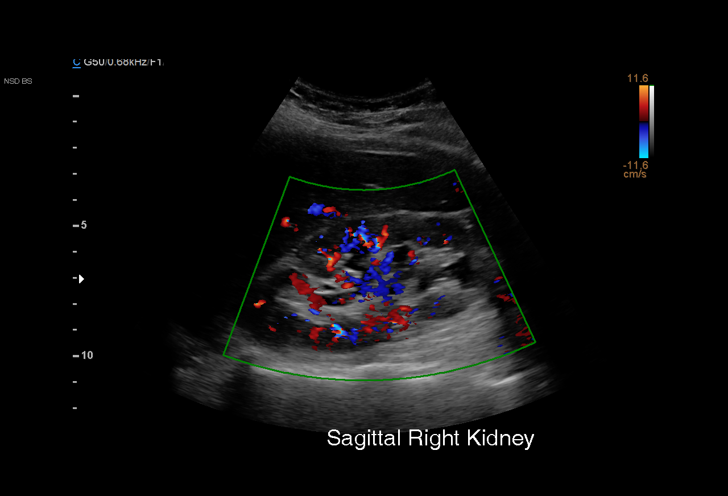
[im 6/35]
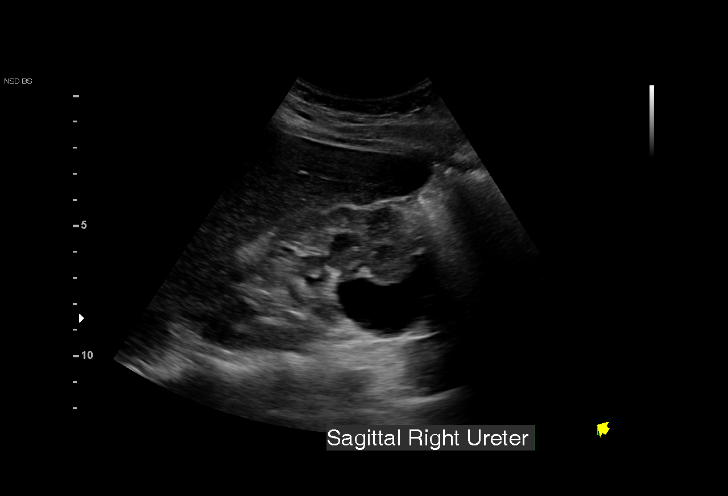
[im 8/35]
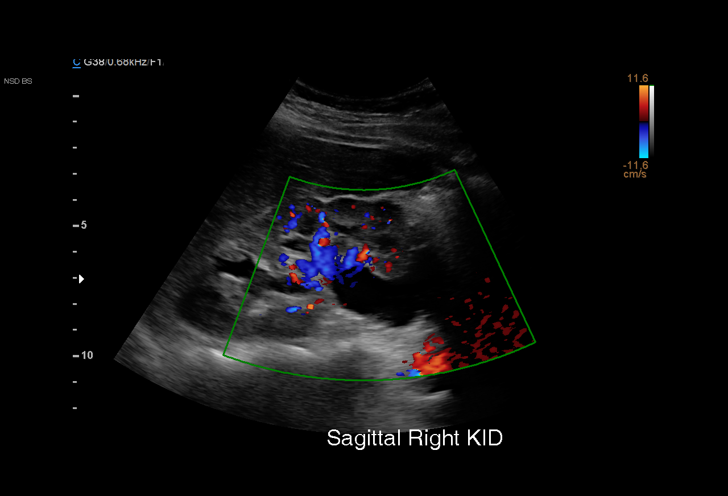
[im 10/35]
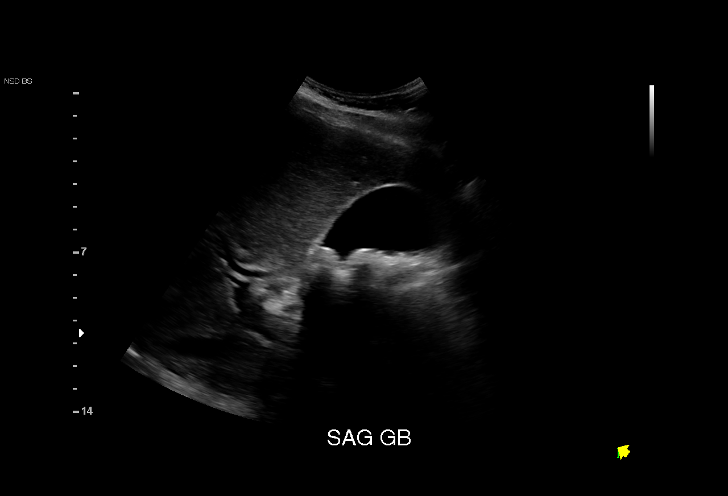
[im 13/35]
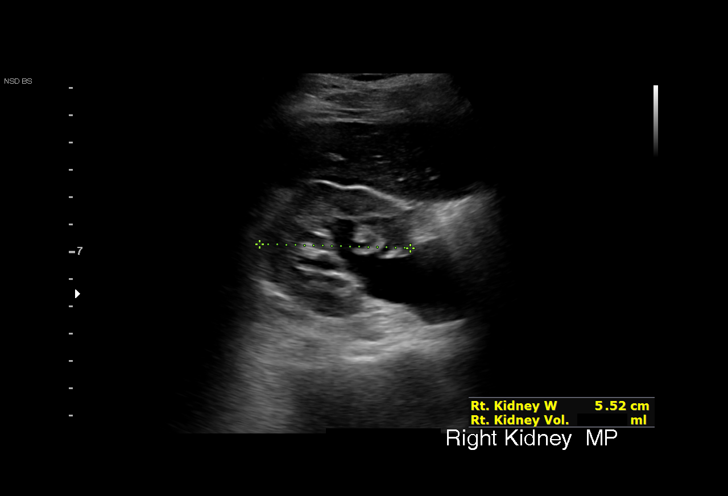
[im 15/35]
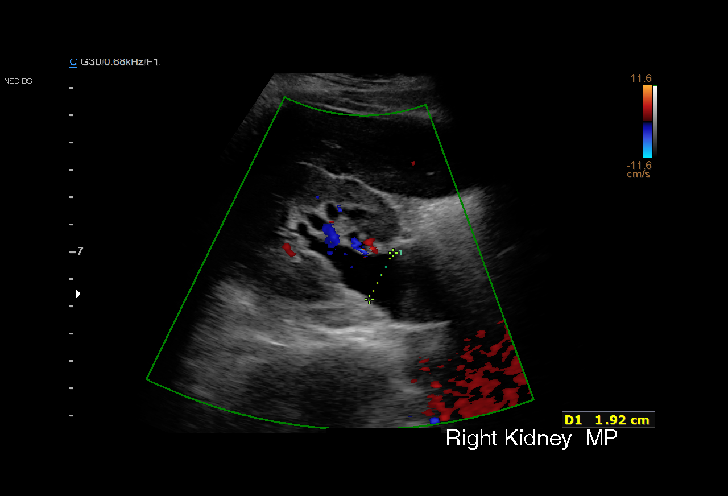
[im 18/35]
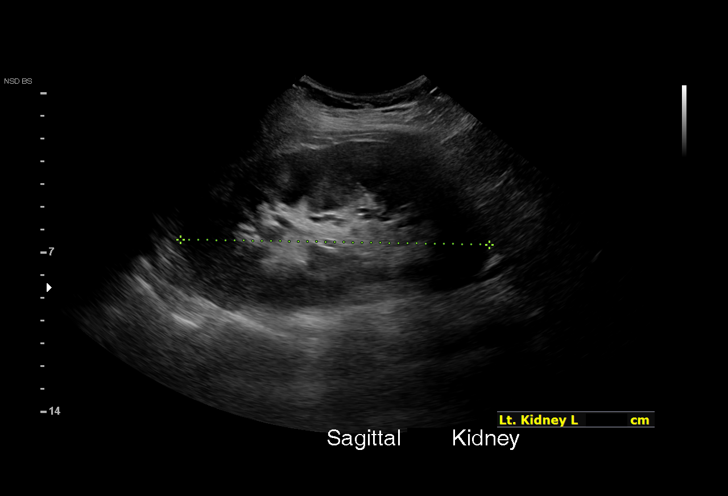
[im 20/35]
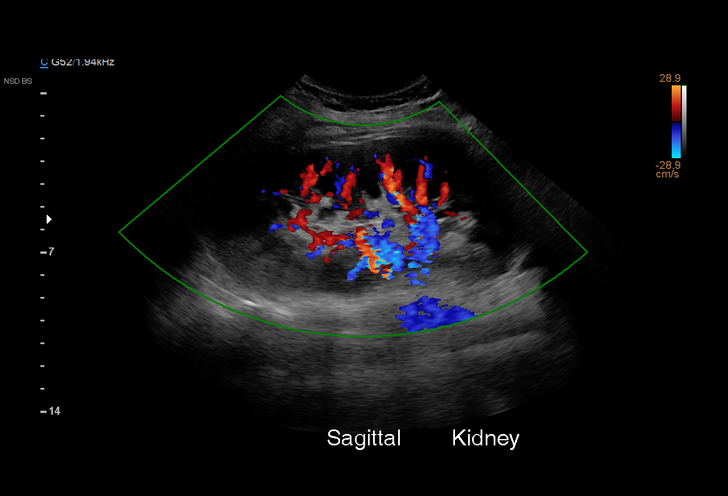
[im 22/35]
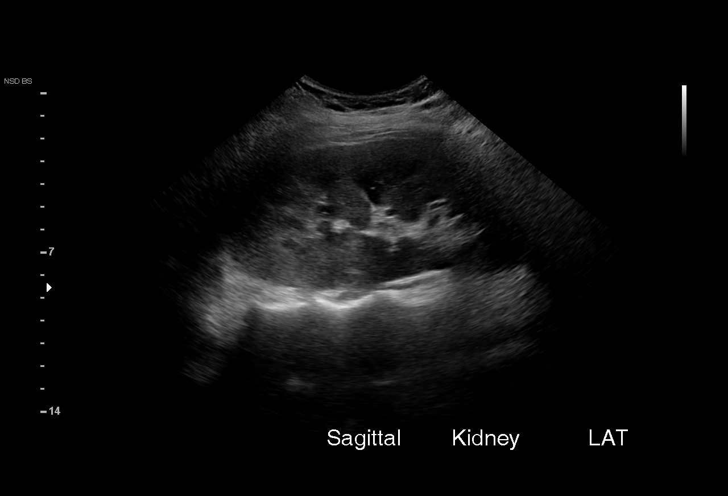
[im 25/35]
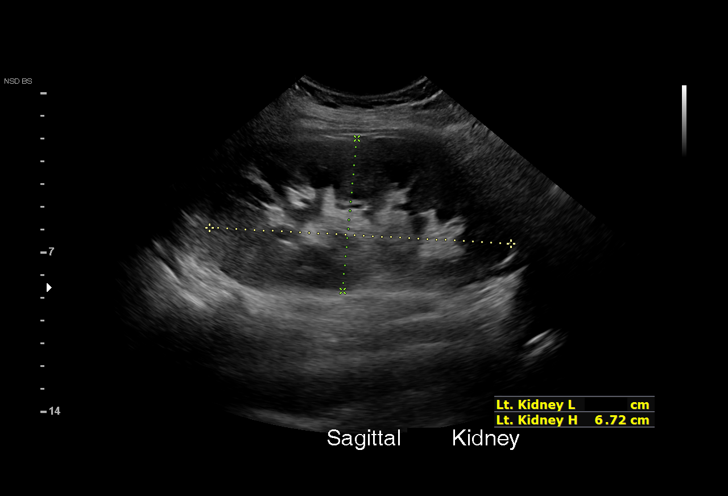
[im 27/35]
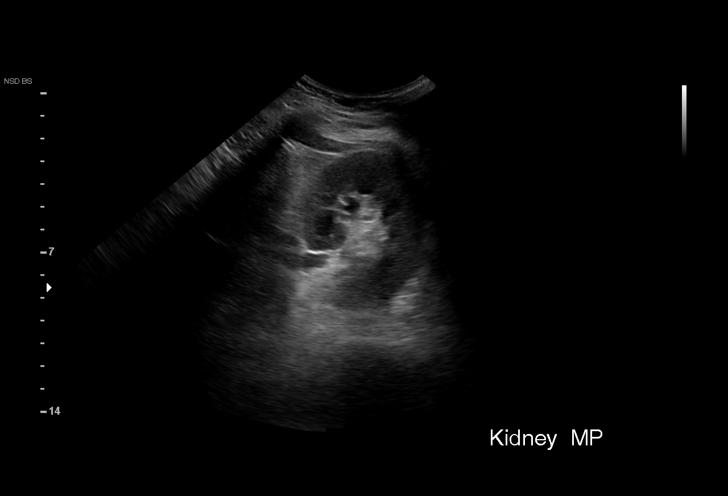
[im 29/35]
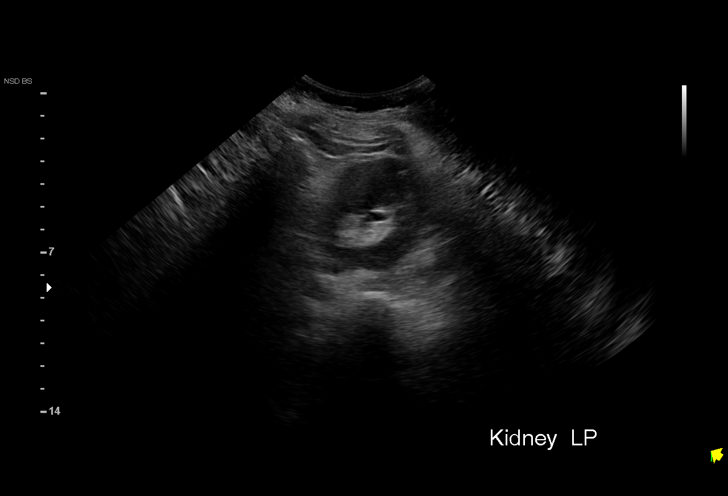
[im 32/35]
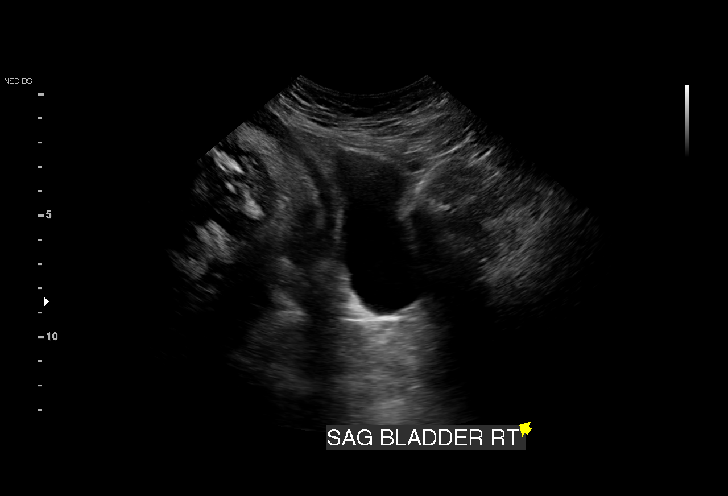
[im 35/35]
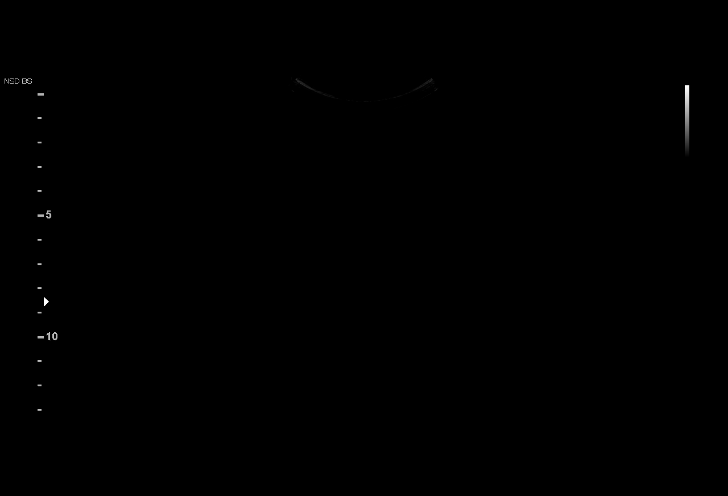

[15 of 25 positions shown; findings below may reference images not displayed]

FINDINGS: Evaluation is somewhat technically difficult due to advanced
gestation.

Right Kidney:

Renal measurements: 11.1 x 4.8 x 5.5 cm = volume: 155 mL.
Echogenicity within normal limits. No mass visualized. Mild
hydronephrosis, which appears improved since 03/19/2021.

Left Kidney:

Renal measurements: 13.4 x 6.7 x 5.5 cm = volume: 260 mL.
Echogenicity within normal limits. No mass or hydronephrosis
visualized.

Bladder:

Appears normal for degree of bladder distention. Bladder jets are
not well visualized.

Other:

Incidental note is made of cholelithiasis.
IMPRESSION: 1. Mild right hydronephrosis, which appears improved since
03/19/2021.
[DATE]. Normal left kidney.
3. Cholelithiasis.
